# Patient Record
Sex: Female | Born: 1945 | Race: White | Hispanic: No | Marital: Married | Smoking: Never smoker
Health system: Southern US, Community
[De-identification: ages and names within clinical notes are randomized; demographics above are authoritative.]

## PROBLEM LIST (undated history)

## (undated) DIAGNOSIS — T4145XA Adverse effect of unspecified anesthetic, initial encounter: Secondary | ICD-10-CM

## (undated) DIAGNOSIS — M65312 Trigger thumb, left thumb: Secondary | ICD-10-CM

## (undated) DIAGNOSIS — R112 Nausea with vomiting, unspecified: Secondary | ICD-10-CM

## (undated) DIAGNOSIS — Z9889 Other specified postprocedural states: Secondary | ICD-10-CM

## (undated) DIAGNOSIS — T8859XA Other complications of anesthesia, initial encounter: Secondary | ICD-10-CM

## (undated) DIAGNOSIS — G5600 Carpal tunnel syndrome, unspecified upper limb: Secondary | ICD-10-CM

## (undated) HISTORY — PX: TUBAL LIGATION: SHX77

## (undated) HISTORY — PX: CARPAL TUNNEL RELEASE: SHX101

## (undated) HISTORY — PX: CLAVICLE SURGERY: SHX598

## (undated) HISTORY — PX: EYE SURGERY: SHX253

---

## 1999-06-30 ENCOUNTER — Encounter: Admission: RE | Admit: 1999-06-30 | Discharge: 1999-06-30 | Payer: Self-pay | Admitting: Family Medicine

## 1999-06-30 ENCOUNTER — Encounter: Payer: Self-pay | Admitting: Family Medicine

## 2000-01-15 ENCOUNTER — Other Ambulatory Visit: Admission: RE | Admit: 2000-01-15 | Discharge: 2000-01-15 | Payer: Self-pay | Admitting: Family Medicine

## 2000-01-19 ENCOUNTER — Encounter: Payer: Self-pay | Admitting: Family Medicine

## 2000-01-19 ENCOUNTER — Encounter: Admission: RE | Admit: 2000-01-19 | Discharge: 2000-01-19 | Payer: Self-pay | Admitting: Family Medicine

## 2001-02-04 ENCOUNTER — Encounter: Admission: RE | Admit: 2001-02-04 | Discharge: 2001-02-04 | Payer: Self-pay | Admitting: Family Medicine

## 2001-02-04 ENCOUNTER — Encounter: Payer: Self-pay | Admitting: Family Medicine

## 2001-02-04 ENCOUNTER — Other Ambulatory Visit: Admission: RE | Admit: 2001-02-04 | Discharge: 2001-02-04 | Payer: Self-pay | Admitting: Family Medicine

## 2002-02-12 ENCOUNTER — Encounter: Admission: RE | Admit: 2002-02-12 | Discharge: 2002-02-12 | Payer: Self-pay | Admitting: Family Medicine

## 2002-02-12 ENCOUNTER — Encounter: Payer: Self-pay | Admitting: Family Medicine

## 2002-02-19 ENCOUNTER — Other Ambulatory Visit: Admission: RE | Admit: 2002-02-19 | Discharge: 2002-02-19 | Payer: Self-pay | Admitting: Family Medicine

## 2003-01-05 ENCOUNTER — Ambulatory Visit (HOSPITAL_BASED_OUTPATIENT_CLINIC_OR_DEPARTMENT_OTHER): Admission: RE | Admit: 2003-01-05 | Discharge: 2003-01-05 | Payer: Self-pay | Admitting: Orthopedic Surgery

## 2003-04-01 ENCOUNTER — Encounter: Payer: Self-pay | Admitting: Family Medicine

## 2003-04-01 ENCOUNTER — Encounter: Admission: RE | Admit: 2003-04-01 | Discharge: 2003-04-01 | Payer: Self-pay | Admitting: Family Medicine

## 2004-05-04 ENCOUNTER — Other Ambulatory Visit: Admission: RE | Admit: 2004-05-04 | Discharge: 2004-05-04 | Payer: Self-pay | Admitting: Family Medicine

## 2004-05-05 ENCOUNTER — Encounter: Admission: RE | Admit: 2004-05-05 | Discharge: 2004-05-05 | Payer: Self-pay | Admitting: Family Medicine

## 2005-05-09 ENCOUNTER — Other Ambulatory Visit: Admission: RE | Admit: 2005-05-09 | Discharge: 2005-05-09 | Payer: Self-pay | Admitting: Family Medicine

## 2005-06-07 ENCOUNTER — Encounter: Admission: RE | Admit: 2005-06-07 | Discharge: 2005-06-07 | Payer: Self-pay | Admitting: Family Medicine

## 2006-06-11 ENCOUNTER — Other Ambulatory Visit: Admission: RE | Admit: 2006-06-11 | Discharge: 2006-06-11 | Payer: Self-pay | Admitting: Family Medicine

## 2006-06-21 ENCOUNTER — Encounter: Admission: RE | Admit: 2006-06-21 | Discharge: 2006-06-21 | Payer: Self-pay | Admitting: Family Medicine

## 2007-06-24 ENCOUNTER — Encounter: Admission: RE | Admit: 2007-06-24 | Discharge: 2007-06-24 | Payer: Self-pay | Admitting: Family Medicine

## 2007-06-24 ENCOUNTER — Other Ambulatory Visit: Admission: RE | Admit: 2007-06-24 | Discharge: 2007-06-24 | Payer: Self-pay | Admitting: Family Medicine

## 2008-06-24 ENCOUNTER — Encounter: Admission: RE | Admit: 2008-06-24 | Discharge: 2008-06-24 | Payer: Self-pay | Admitting: Family Medicine

## 2008-07-01 ENCOUNTER — Other Ambulatory Visit: Admission: RE | Admit: 2008-07-01 | Discharge: 2008-07-01 | Payer: Self-pay | Admitting: Family Medicine

## 2008-07-05 ENCOUNTER — Encounter: Admission: RE | Admit: 2008-07-05 | Discharge: 2008-07-05 | Payer: Self-pay | Admitting: Family Medicine

## 2009-03-26 ENCOUNTER — Encounter: Admission: RE | Admit: 2009-03-26 | Discharge: 2009-03-26 | Payer: Self-pay | Admitting: Family Medicine

## 2009-07-07 ENCOUNTER — Encounter: Admission: RE | Admit: 2009-07-07 | Discharge: 2009-07-07 | Payer: Self-pay | Admitting: Internal Medicine

## 2010-07-10 ENCOUNTER — Encounter: Admission: RE | Admit: 2010-07-10 | Discharge: 2010-07-10 | Payer: Self-pay | Admitting: Internal Medicine

## 2010-07-31 ENCOUNTER — Other Ambulatory Visit
Admission: RE | Admit: 2010-07-31 | Discharge: 2010-07-31 | Payer: Self-pay | Source: Home / Self Care | Admitting: Internal Medicine

## 2011-01-12 NOTE — Op Note (Signed)
   NAME:  Traci Chavez, Traci Chavez NO.:  [id number]   MEDICAL RECORD NO.:  [id number]                   PATIENT TYPE:  AMB   LOCATION:  DSC                                  FACILITY:  [facility]   PHYSICIAN:  Cindee Salt, M.D.                    DATE OF BIRTH:  07-28-1946   DATE OF PROCEDURE:  01/05/2003  DATE OF DISCHARGE:                                 OPERATIVE REPORT   PREOPERATIVE DIAGNOSIS:  Carpal tunnel syndrome, right hand.   POSTOPERATIVE DIAGNOSIS:  Carpal tunnel syndrome, right hand.   OPERATION:  Decompression of right median nerve.   SURGEON:  Cindee Salt, M.D.   ASSISTANT:  Alfredo Bach, R.N.   ANESTHESIA:  Forearm-based IV regional.   HISTORY:  The patient is a 65 year old female with a history of carpal  tunnel syndrome, EMG and nerve conductions positive, which has not responded  to conservative treatment.   DESCRIPTION OF PROCEDURE:  The patient was brought to the operating room  where a forearm-based IV regional anesthetic was carried out without  difficulty.  She was prepped and draped using Duraprep, supine position,  right arm free.  A longitudinal incision was made in the palm and carried  down through subcutaneous tissue.  Bleeders were electrocauterized.  The  palmar fascia was split.  The superficial palmar arch was identified, the  flexor tendons to the ring and little finger identified.  To the ulnar side  of the median nerve, the carpal retinaculum was incised with sharp  dissection.  A right-angle and Sewall retractor were placed between skin and  forearm fascia.  The fascia was released for approximately 3 cm proximal to  the wrist crease under direct vision.  Canal was explored.  Moderate  erythematous changes and neovascularization were present on the nerve with a  mild deformity, hourglass in nature, observed.  The tenosynovial tissue was  moderately thickened.  No further lesions were identified.  The wound was  irrigated.   The skin was closed with interrupted 5-0 nylon sutures.  A  sterile compressive dressing and splint were applied.  The patient tolerated  the procedure well and was taken to the recovery room for observation in  satisfactory condition.   She is discharged home to return to the [facility] in one  week on Vicodin and Keflex.                                               Cindee Salt, M.D.    GK/MEDQ  D:  01/05/2003  T:  01/06/2003  Job:  [id number]

## 2011-01-17 ENCOUNTER — Other Ambulatory Visit: Payer: Self-pay | Admitting: Internal Medicine

## 2011-01-17 DIAGNOSIS — N6310 Unspecified lump in the right breast, unspecified quadrant: Secondary | ICD-10-CM

## 2011-01-19 ENCOUNTER — Ambulatory Visit
Admission: RE | Admit: 2011-01-19 | Discharge: 2011-01-19 | Disposition: A | Discharge status: Home / Self Care | Payer: Medicare Other | Source: Ambulatory Visit | Attending: Internal Medicine | Admitting: Internal Medicine

## 2011-01-19 DIAGNOSIS — N6310 Unspecified lump in the right breast, unspecified quadrant: Secondary | ICD-10-CM

## 2011-01-31 ENCOUNTER — Other Ambulatory Visit: Payer: Self-pay | Admitting: Dermatology

## 2011-06-18 ENCOUNTER — Other Ambulatory Visit: Payer: Self-pay | Admitting: Internal Medicine

## 2011-06-18 DIAGNOSIS — Z1231 Encounter for screening mammogram for malignant neoplasm of breast: Secondary | ICD-10-CM

## 2011-07-09 ENCOUNTER — Ambulatory Visit: Payer: Medicare Other

## 2011-07-20 ENCOUNTER — Ambulatory Visit
Admission: RE | Admit: 2011-07-20 | Discharge: 2011-07-20 | Disposition: A | Discharge status: Home / Self Care | Payer: Medicare Other | Source: Ambulatory Visit | Attending: Internal Medicine | Admitting: Internal Medicine

## 2011-07-20 DIAGNOSIS — Z1231 Encounter for screening mammogram for malignant neoplasm of breast: Secondary | ICD-10-CM

## 2012-06-10 ENCOUNTER — Other Ambulatory Visit: Payer: Self-pay | Admitting: Internal Medicine

## 2012-06-10 DIAGNOSIS — Z1231 Encounter for screening mammogram for malignant neoplasm of breast: Secondary | ICD-10-CM

## 2012-07-21 ENCOUNTER — Ambulatory Visit
Admission: RE | Admit: 2012-07-21 | Discharge: 2012-07-21 | Disposition: A | Discharge status: Home / Self Care | Payer: Medicare Other | Source: Ambulatory Visit | Attending: Internal Medicine | Admitting: Internal Medicine

## 2012-07-21 DIAGNOSIS — Z1231 Encounter for screening mammogram for malignant neoplasm of breast: Secondary | ICD-10-CM | POA: Diagnosis not present

## 2012-07-23 ENCOUNTER — Other Ambulatory Visit: Payer: Self-pay | Admitting: Internal Medicine

## 2012-07-23 DIAGNOSIS — R928 Other abnormal and inconclusive findings on diagnostic imaging of breast: Secondary | ICD-10-CM

## 2012-07-29 ENCOUNTER — Ambulatory Visit
Admission: RE | Admit: 2012-07-29 | Discharge: 2012-07-29 | Disposition: A | Discharge status: Home / Self Care | Payer: Medicare Other | Source: Ambulatory Visit | Attending: Internal Medicine | Admitting: Internal Medicine

## 2012-07-29 DIAGNOSIS — R928 Other abnormal and inconclusive findings on diagnostic imaging of breast: Secondary | ICD-10-CM

## 2012-08-14 DIAGNOSIS — H04129 Dry eye syndrome of unspecified lacrimal gland: Secondary | ICD-10-CM | POA: Diagnosis not present

## 2012-08-14 DIAGNOSIS — H2510 Age-related nuclear cataract, unspecified eye: Secondary | ICD-10-CM | POA: Diagnosis not present

## 2012-08-28 DIAGNOSIS — Z1331 Encounter for screening for depression: Secondary | ICD-10-CM | POA: Diagnosis not present

## 2012-08-28 DIAGNOSIS — E785 Hyperlipidemia, unspecified: Secondary | ICD-10-CM | POA: Diagnosis not present

## 2012-08-28 DIAGNOSIS — M5137 Other intervertebral disc degeneration, lumbosacral region: Secondary | ICD-10-CM | POA: Diagnosis not present

## 2012-08-28 DIAGNOSIS — Z0000 Encounter for general adult medical examination without abnormal findings: Secondary | ICD-10-CM | POA: Diagnosis not present

## 2012-08-28 DIAGNOSIS — R7309 Other abnormal glucose: Secondary | ICD-10-CM | POA: Diagnosis not present

## 2012-08-28 DIAGNOSIS — D649 Anemia, unspecified: Secondary | ICD-10-CM | POA: Diagnosis not present

## 2013-02-05 DIAGNOSIS — J019 Acute sinusitis, unspecified: Secondary | ICD-10-CM | POA: Diagnosis not present

## 2013-04-01 DIAGNOSIS — N390 Urinary tract infection, site not specified: Secondary | ICD-10-CM | POA: Diagnosis not present

## 2013-06-19 DIAGNOSIS — N390 Urinary tract infection, site not specified: Secondary | ICD-10-CM | POA: Diagnosis not present

## 2013-06-19 DIAGNOSIS — R300 Dysuria: Secondary | ICD-10-CM | POA: Diagnosis not present

## 2013-07-01 ENCOUNTER — Other Ambulatory Visit: Payer: Self-pay

## 2013-07-01 DIAGNOSIS — Z1231 Encounter for screening mammogram for malignant neoplasm of breast: Secondary | ICD-10-CM

## 2013-07-17 ENCOUNTER — Other Ambulatory Visit: Payer: Self-pay | Admitting: Obstetrics & Gynecology

## 2013-07-17 ENCOUNTER — Other Ambulatory Visit (HOSPITAL_COMMUNITY)
Admission: RE | Admit: 2013-07-17 | Discharge: 2013-07-17 | Disposition: A | Discharge status: Home / Self Care | Payer: Medicare Other | Source: Ambulatory Visit | Attending: Obstetrics & Gynecology | Admitting: Obstetrics & Gynecology

## 2013-07-17 DIAGNOSIS — Z124 Encounter for screening for malignant neoplasm of cervix: Secondary | ICD-10-CM | POA: Insufficient documentation

## 2013-07-17 DIAGNOSIS — N905 Atrophy of vulva: Secondary | ICD-10-CM | POA: Diagnosis not present

## 2013-07-17 DIAGNOSIS — N9089 Other specified noninflammatory disorders of vulva and perineum: Secondary | ICD-10-CM | POA: Diagnosis not present

## 2013-07-17 DIAGNOSIS — Z1151 Encounter for screening for human papillomavirus (HPV): Secondary | ICD-10-CM | POA: Diagnosis not present

## 2013-07-17 DIAGNOSIS — L940 Localized scleroderma [morphea]: Secondary | ICD-10-CM | POA: Diagnosis not present

## 2013-07-17 DIAGNOSIS — Z01419 Encounter for gynecological examination (general) (routine) without abnormal findings: Secondary | ICD-10-CM | POA: Diagnosis not present

## 2013-08-04 ENCOUNTER — Ambulatory Visit
Admission: RE | Admit: 2013-08-04 | Discharge: 2013-08-04 | Disposition: A | Discharge status: Home / Self Care | Payer: Medicare Other | Source: Ambulatory Visit

## 2013-08-04 DIAGNOSIS — Z1231 Encounter for screening mammogram for malignant neoplasm of breast: Secondary | ICD-10-CM

## 2013-08-24 DIAGNOSIS — N905 Atrophy of vulva: Secondary | ICD-10-CM | POA: Diagnosis not present

## 2013-08-24 DIAGNOSIS — N72 Inflammatory disease of cervix uteri: Secondary | ICD-10-CM | POA: Diagnosis not present

## 2013-09-01 DIAGNOSIS — Z23 Encounter for immunization: Secondary | ICD-10-CM | POA: Diagnosis not present

## 2013-09-01 DIAGNOSIS — Z0000 Encounter for general adult medical examination without abnormal findings: Secondary | ICD-10-CM | POA: Diagnosis not present

## 2013-09-01 DIAGNOSIS — R7309 Other abnormal glucose: Secondary | ICD-10-CM | POA: Diagnosis not present

## 2013-09-01 DIAGNOSIS — Z1331 Encounter for screening for depression: Secondary | ICD-10-CM | POA: Diagnosis not present

## 2013-09-01 DIAGNOSIS — E785 Hyperlipidemia, unspecified: Secondary | ICD-10-CM | POA: Diagnosis not present

## 2013-09-01 DIAGNOSIS — D649 Anemia, unspecified: Secondary | ICD-10-CM | POA: Diagnosis not present

## 2013-09-07 DIAGNOSIS — H04129 Dry eye syndrome of unspecified lacrimal gland: Secondary | ICD-10-CM | POA: Diagnosis not present

## 2013-09-07 DIAGNOSIS — H2510 Age-related nuclear cataract, unspecified eye: Secondary | ICD-10-CM | POA: Diagnosis not present

## 2013-11-09 DIAGNOSIS — S61209A Unspecified open wound of unspecified finger without damage to nail, initial encounter: Secondary | ICD-10-CM | POA: Diagnosis not present

## 2013-12-31 ENCOUNTER — Other Ambulatory Visit: Payer: Self-pay | Admitting: Dermatology

## 2013-12-31 DIAGNOSIS — L820 Inflamed seborrheic keratosis: Secondary | ICD-10-CM | POA: Diagnosis not present

## 2013-12-31 DIAGNOSIS — C44320 Squamous cell carcinoma of skin of unspecified parts of face: Secondary | ICD-10-CM | POA: Diagnosis not present

## 2014-01-14 DIAGNOSIS — Z85828 Personal history of other malignant neoplasm of skin: Secondary | ICD-10-CM | POA: Diagnosis not present

## 2014-01-14 DIAGNOSIS — C44319 Basal cell carcinoma of skin of other parts of face: Secondary | ICD-10-CM | POA: Diagnosis not present

## 2014-02-25 DIAGNOSIS — N905 Atrophy of vulva: Secondary | ICD-10-CM | POA: Diagnosis not present

## 2014-02-25 DIAGNOSIS — N7689 Other specified inflammation of vagina and vulva: Secondary | ICD-10-CM | POA: Diagnosis not present

## 2014-02-25 DIAGNOSIS — N72 Inflammatory disease of cervix uteri: Secondary | ICD-10-CM | POA: Diagnosis not present

## 2014-07-01 DIAGNOSIS — H6691 Otitis media, unspecified, right ear: Secondary | ICD-10-CM | POA: Diagnosis not present

## 2014-07-05 ENCOUNTER — Other Ambulatory Visit: Payer: Self-pay

## 2014-07-05 DIAGNOSIS — Z1231 Encounter for screening mammogram for malignant neoplasm of breast: Secondary | ICD-10-CM

## 2014-07-06 DIAGNOSIS — H7291 Unspecified perforation of tympanic membrane, right ear: Secondary | ICD-10-CM | POA: Diagnosis not present

## 2014-07-06 DIAGNOSIS — H9201 Otalgia, right ear: Secondary | ICD-10-CM | POA: Diagnosis not present

## 2014-07-12 DIAGNOSIS — H902 Conductive hearing loss, unspecified: Secondary | ICD-10-CM | POA: Diagnosis not present

## 2014-07-12 DIAGNOSIS — H6501 Acute serous otitis media, right ear: Secondary | ICD-10-CM | POA: Diagnosis not present

## 2014-07-21 DIAGNOSIS — Z85828 Personal history of other malignant neoplasm of skin: Secondary | ICD-10-CM | POA: Diagnosis not present

## 2014-07-21 DIAGNOSIS — L814 Other melanin hyperpigmentation: Secondary | ICD-10-CM | POA: Diagnosis not present

## 2014-07-21 DIAGNOSIS — L919 Hypertrophic disorder of the skin, unspecified: Secondary | ICD-10-CM | POA: Diagnosis not present

## 2014-07-21 DIAGNOSIS — D2371 Other benign neoplasm of skin of right lower limb, including hip: Secondary | ICD-10-CM | POA: Diagnosis not present

## 2014-07-21 DIAGNOSIS — D1801 Hemangioma of skin and subcutaneous tissue: Secondary | ICD-10-CM | POA: Diagnosis not present

## 2014-08-05 ENCOUNTER — Ambulatory Visit
Admission: RE | Admit: 2014-08-05 | Discharge: 2014-08-05 | Disposition: A | Discharge status: Home / Self Care | Payer: Medicare Other | Source: Ambulatory Visit

## 2014-08-05 DIAGNOSIS — Z1231 Encounter for screening mammogram for malignant neoplasm of breast: Secondary | ICD-10-CM | POA: Diagnosis not present

## 2014-09-02 DIAGNOSIS — M519 Unspecified thoracic, thoracolumbar and lumbosacral intervertebral disc disorder: Secondary | ICD-10-CM | POA: Diagnosis not present

## 2014-09-02 DIAGNOSIS — E780 Pure hypercholesterolemia: Secondary | ICD-10-CM | POA: Diagnosis not present

## 2014-09-02 DIAGNOSIS — Z23 Encounter for immunization: Secondary | ICD-10-CM | POA: Diagnosis not present

## 2014-09-02 DIAGNOSIS — Z0000 Encounter for general adult medical examination without abnormal findings: Secondary | ICD-10-CM | POA: Diagnosis not present

## 2014-09-02 DIAGNOSIS — Z1389 Encounter for screening for other disorder: Secondary | ICD-10-CM | POA: Diagnosis not present

## 2014-09-02 DIAGNOSIS — D509 Iron deficiency anemia, unspecified: Secondary | ICD-10-CM | POA: Diagnosis not present

## 2014-09-23 DIAGNOSIS — H2513 Age-related nuclear cataract, bilateral: Secondary | ICD-10-CM | POA: Diagnosis not present

## 2015-01-17 DIAGNOSIS — K1379 Other lesions of oral mucosa: Secondary | ICD-10-CM | POA: Diagnosis not present

## 2015-01-18 DIAGNOSIS — K1379 Other lesions of oral mucosa: Secondary | ICD-10-CM | POA: Diagnosis not present

## 2015-01-25 DIAGNOSIS — K1379 Other lesions of oral mucosa: Secondary | ICD-10-CM | POA: Diagnosis not present

## 2015-05-13 DIAGNOSIS — Z85828 Personal history of other malignant neoplasm of skin: Secondary | ICD-10-CM | POA: Diagnosis not present

## 2015-05-13 DIAGNOSIS — L820 Inflamed seborrheic keratosis: Secondary | ICD-10-CM | POA: Diagnosis not present

## 2015-05-19 DIAGNOSIS — N905 Atrophy of vulva: Secondary | ICD-10-CM | POA: Diagnosis not present

## 2015-05-19 DIAGNOSIS — N7689 Other specified inflammation of vagina and vulva: Secondary | ICD-10-CM | POA: Diagnosis not present

## 2015-08-18 ENCOUNTER — Other Ambulatory Visit: Payer: Self-pay

## 2015-08-18 DIAGNOSIS — Z1231 Encounter for screening mammogram for malignant neoplasm of breast: Secondary | ICD-10-CM

## 2015-09-08 DIAGNOSIS — M899 Disorder of bone, unspecified: Secondary | ICD-10-CM | POA: Diagnosis not present

## 2015-09-08 DIAGNOSIS — E7800 Pure hypercholesterolemia, unspecified: Secondary | ICD-10-CM | POA: Diagnosis not present

## 2015-09-08 DIAGNOSIS — Z1389 Encounter for screening for other disorder: Secondary | ICD-10-CM | POA: Diagnosis not present

## 2015-09-08 DIAGNOSIS — Z23 Encounter for immunization: Secondary | ICD-10-CM | POA: Diagnosis not present

## 2015-09-08 DIAGNOSIS — Z0000 Encounter for general adult medical examination without abnormal findings: Secondary | ICD-10-CM | POA: Diagnosis not present

## 2015-09-08 DIAGNOSIS — M1990 Unspecified osteoarthritis, unspecified site: Secondary | ICD-10-CM | POA: Diagnosis not present

## 2015-09-08 DIAGNOSIS — M519 Unspecified thoracic, thoracolumbar and lumbosacral intervertebral disc disorder: Secondary | ICD-10-CM | POA: Diagnosis not present

## 2015-09-08 DIAGNOSIS — D509 Iron deficiency anemia, unspecified: Secondary | ICD-10-CM | POA: Diagnosis not present

## 2015-09-19 ENCOUNTER — Ambulatory Visit
Admission: RE | Admit: 2015-09-19 | Discharge: 2015-09-19 | Disposition: A | Discharge status: Home / Self Care | Payer: Medicare Other | Source: Ambulatory Visit

## 2015-09-19 DIAGNOSIS — Z1231 Encounter for screening mammogram for malignant neoplasm of breast: Secondary | ICD-10-CM

## 2015-10-03 DIAGNOSIS — H04123 Dry eye syndrome of bilateral lacrimal glands: Secondary | ICD-10-CM | POA: Diagnosis not present

## 2015-10-03 DIAGNOSIS — H2513 Age-related nuclear cataract, bilateral: Secondary | ICD-10-CM | POA: Diagnosis not present

## 2015-10-06 DIAGNOSIS — D2262 Melanocytic nevi of left upper limb, including shoulder: Secondary | ICD-10-CM | POA: Diagnosis not present

## 2015-10-06 DIAGNOSIS — Z85828 Personal history of other malignant neoplasm of skin: Secondary | ICD-10-CM | POA: Diagnosis not present

## 2015-10-06 DIAGNOSIS — L814 Other melanin hyperpigmentation: Secondary | ICD-10-CM | POA: Diagnosis not present

## 2015-10-06 DIAGNOSIS — L821 Other seborrheic keratosis: Secondary | ICD-10-CM | POA: Diagnosis not present

## 2015-10-06 DIAGNOSIS — D485 Neoplasm of uncertain behavior of skin: Secondary | ICD-10-CM | POA: Diagnosis not present

## 2015-10-06 DIAGNOSIS — D1801 Hemangioma of skin and subcutaneous tissue: Secondary | ICD-10-CM | POA: Diagnosis not present

## 2015-10-06 DIAGNOSIS — L570 Actinic keratosis: Secondary | ICD-10-CM | POA: Diagnosis not present

## 2015-10-12 DIAGNOSIS — H2512 Age-related nuclear cataract, left eye: Secondary | ICD-10-CM | POA: Diagnosis not present

## 2015-10-12 DIAGNOSIS — H2511 Age-related nuclear cataract, right eye: Secondary | ICD-10-CM | POA: Diagnosis not present

## 2015-10-13 DIAGNOSIS — M859 Disorder of bone density and structure, unspecified: Secondary | ICD-10-CM | POA: Diagnosis not present

## 2015-10-13 DIAGNOSIS — M8589 Other specified disorders of bone density and structure, multiple sites: Secondary | ICD-10-CM | POA: Diagnosis not present

## 2015-10-19 DIAGNOSIS — H2511 Age-related nuclear cataract, right eye: Secondary | ICD-10-CM | POA: Diagnosis not present

## 2016-03-01 DIAGNOSIS — Z961 Presence of intraocular lens: Secondary | ICD-10-CM | POA: Diagnosis not present

## 2016-05-22 DIAGNOSIS — N952 Postmenopausal atrophic vaginitis: Secondary | ICD-10-CM | POA: Diagnosis not present

## 2016-05-22 DIAGNOSIS — Z01419 Encounter for gynecological examination (general) (routine) without abnormal findings: Secondary | ICD-10-CM | POA: Diagnosis not present

## 2016-05-22 DIAGNOSIS — N941 Unspecified dyspareunia: Secondary | ICD-10-CM | POA: Diagnosis not present

## 2016-08-17 ENCOUNTER — Other Ambulatory Visit: Payer: Self-pay | Admitting: Internal Medicine

## 2016-08-17 DIAGNOSIS — Z1231 Encounter for screening mammogram for malignant neoplasm of breast: Secondary | ICD-10-CM

## 2016-09-11 DIAGNOSIS — Z1159 Encounter for screening for other viral diseases: Secondary | ICD-10-CM | POA: Diagnosis not present

## 2016-09-11 DIAGNOSIS — Z0000 Encounter for general adult medical examination without abnormal findings: Secondary | ICD-10-CM | POA: Diagnosis not present

## 2016-09-11 DIAGNOSIS — M899 Disorder of bone, unspecified: Secondary | ICD-10-CM | POA: Diagnosis not present

## 2016-09-11 DIAGNOSIS — Z1389 Encounter for screening for other disorder: Secondary | ICD-10-CM | POA: Diagnosis not present

## 2016-09-11 DIAGNOSIS — M1990 Unspecified osteoarthritis, unspecified site: Secondary | ICD-10-CM | POA: Diagnosis not present

## 2016-09-11 DIAGNOSIS — E7800 Pure hypercholesterolemia, unspecified: Secondary | ICD-10-CM | POA: Diagnosis not present

## 2016-09-11 DIAGNOSIS — D509 Iron deficiency anemia, unspecified: Secondary | ICD-10-CM | POA: Diagnosis not present

## 2016-09-25 ENCOUNTER — Ambulatory Visit
Admission: RE | Admit: 2016-09-25 | Discharge: 2016-09-25 | Disposition: A | Discharge status: Home / Self Care | Payer: Medicare Other | Source: Ambulatory Visit | Attending: Internal Medicine | Admitting: Internal Medicine

## 2016-09-25 DIAGNOSIS — Z1231 Encounter for screening mammogram for malignant neoplasm of breast: Secondary | ICD-10-CM

## 2016-12-27 DIAGNOSIS — D2271 Melanocytic nevi of right lower limb, including hip: Secondary | ICD-10-CM | POA: Diagnosis not present

## 2016-12-27 DIAGNOSIS — L918 Other hypertrophic disorders of the skin: Secondary | ICD-10-CM | POA: Diagnosis not present

## 2016-12-27 DIAGNOSIS — D2261 Melanocytic nevi of right upper limb, including shoulder: Secondary | ICD-10-CM | POA: Diagnosis not present

## 2016-12-27 DIAGNOSIS — L821 Other seborrheic keratosis: Secondary | ICD-10-CM | POA: Diagnosis not present

## 2016-12-27 DIAGNOSIS — L814 Other melanin hyperpigmentation: Secondary | ICD-10-CM | POA: Diagnosis not present

## 2016-12-27 DIAGNOSIS — D485 Neoplasm of uncertain behavior of skin: Secondary | ICD-10-CM | POA: Diagnosis not present

## 2016-12-27 DIAGNOSIS — D225 Melanocytic nevi of trunk: Secondary | ICD-10-CM | POA: Diagnosis not present

## 2016-12-27 DIAGNOSIS — D2272 Melanocytic nevi of left lower limb, including hip: Secondary | ICD-10-CM | POA: Diagnosis not present

## 2016-12-27 DIAGNOSIS — D2262 Melanocytic nevi of left upper limb, including shoulder: Secondary | ICD-10-CM | POA: Diagnosis not present

## 2016-12-27 DIAGNOSIS — D2371 Other benign neoplasm of skin of right lower limb, including hip: Secondary | ICD-10-CM | POA: Diagnosis not present

## 2016-12-27 DIAGNOSIS — Z85828 Personal history of other malignant neoplasm of skin: Secondary | ICD-10-CM | POA: Diagnosis not present

## 2016-12-27 DIAGNOSIS — D1801 Hemangioma of skin and subcutaneous tissue: Secondary | ICD-10-CM | POA: Diagnosis not present

## 2017-01-02 DIAGNOSIS — R202 Paresthesia of skin: Secondary | ICD-10-CM | POA: Diagnosis not present

## 2017-01-02 DIAGNOSIS — M47812 Spondylosis without myelopathy or radiculopathy, cervical region: Secondary | ICD-10-CM | POA: Diagnosis not present

## 2017-01-02 DIAGNOSIS — G5602 Carpal tunnel syndrome, left upper limb: Secondary | ICD-10-CM | POA: Diagnosis not present

## 2017-01-02 DIAGNOSIS — M79642 Pain in left hand: Secondary | ICD-10-CM | POA: Diagnosis not present

## 2017-01-02 DIAGNOSIS — R200 Anesthesia of skin: Secondary | ICD-10-CM | POA: Diagnosis not present

## 2017-01-17 DIAGNOSIS — Z85828 Personal history of other malignant neoplasm of skin: Secondary | ICD-10-CM | POA: Diagnosis not present

## 2017-01-17 DIAGNOSIS — D485 Neoplasm of uncertain behavior of skin: Secondary | ICD-10-CM | POA: Diagnosis not present

## 2017-01-17 DIAGNOSIS — L988 Other specified disorders of the skin and subcutaneous tissue: Secondary | ICD-10-CM | POA: Diagnosis not present

## 2017-01-18 DIAGNOSIS — M5412 Radiculopathy, cervical region: Secondary | ICD-10-CM | POA: Diagnosis not present

## 2017-01-18 DIAGNOSIS — G5602 Carpal tunnel syndrome, left upper limb: Secondary | ICD-10-CM | POA: Diagnosis not present

## 2017-02-13 DIAGNOSIS — R200 Anesthesia of skin: Secondary | ICD-10-CM | POA: Diagnosis not present

## 2017-02-13 DIAGNOSIS — M47812 Spondylosis without myelopathy or radiculopathy, cervical region: Secondary | ICD-10-CM | POA: Diagnosis not present

## 2017-02-13 DIAGNOSIS — M79622 Pain in left upper arm: Secondary | ICD-10-CM | POA: Diagnosis not present

## 2017-03-04 DIAGNOSIS — M26629 Arthralgia of temporomandibular joint, unspecified side: Secondary | ICD-10-CM | POA: Diagnosis not present

## 2017-03-07 DIAGNOSIS — Z961 Presence of intraocular lens: Secondary | ICD-10-CM | POA: Diagnosis not present

## 2017-04-12 DIAGNOSIS — M47812 Spondylosis without myelopathy or radiculopathy, cervical region: Secondary | ICD-10-CM | POA: Diagnosis not present

## 2017-04-12 DIAGNOSIS — G5602 Carpal tunnel syndrome, left upper limb: Secondary | ICD-10-CM | POA: Diagnosis not present

## 2017-04-12 DIAGNOSIS — R200 Anesthesia of skin: Secondary | ICD-10-CM | POA: Diagnosis not present

## 2017-04-12 DIAGNOSIS — M65312 Trigger thumb, left thumb: Secondary | ICD-10-CM | POA: Diagnosis not present

## 2017-05-13 ENCOUNTER — Other Ambulatory Visit: Payer: Self-pay | Admitting: Orthopedic Surgery

## 2017-05-13 DIAGNOSIS — M65312 Trigger thumb, left thumb: Secondary | ICD-10-CM | POA: Diagnosis not present

## 2017-05-13 DIAGNOSIS — M47812 Spondylosis without myelopathy or radiculopathy, cervical region: Secondary | ICD-10-CM | POA: Diagnosis not present

## 2017-05-13 DIAGNOSIS — G5602 Carpal tunnel syndrome, left upper limb: Secondary | ICD-10-CM | POA: Diagnosis not present

## 2017-05-23 DIAGNOSIS — N941 Unspecified dyspareunia: Secondary | ICD-10-CM | POA: Diagnosis not present

## 2017-05-23 DIAGNOSIS — N952 Postmenopausal atrophic vaginitis: Secondary | ICD-10-CM | POA: Diagnosis not present

## 2017-05-27 ENCOUNTER — Encounter (HOSPITAL_BASED_OUTPATIENT_CLINIC_OR_DEPARTMENT_OTHER): Payer: Self-pay | Admitting: *Deleted

## 2017-06-04 ENCOUNTER — Ambulatory Visit (HOSPITAL_BASED_OUTPATIENT_CLINIC_OR_DEPARTMENT_OTHER): Admission: RE | Admit: 2017-06-04 | Payer: Medicare Other | Source: Ambulatory Visit | Admitting: Orthopedic Surgery

## 2017-06-04 HISTORY — DX: Trigger thumb, left thumb: M65.312

## 2017-06-04 HISTORY — DX: Other specified postprocedural states: Z98.890

## 2017-06-04 HISTORY — DX: Carpal tunnel syndrome, unspecified upper limb: G56.00

## 2017-06-04 HISTORY — DX: Other complications of anesthesia, initial encounter: T88.59XA

## 2017-06-04 HISTORY — DX: Adverse effect of unspecified anesthetic, initial encounter: T41.45XA

## 2017-06-04 HISTORY — DX: Other specified postprocedural states: R11.2

## 2017-06-04 SURGERY — CARPAL TUNNEL RELEASE
Anesthesia: Regional | Laterality: Left

## 2017-06-26 DIAGNOSIS — M65312 Trigger thumb, left thumb: Secondary | ICD-10-CM | POA: Diagnosis not present

## 2017-06-26 DIAGNOSIS — G5602 Carpal tunnel syndrome, left upper limb: Secondary | ICD-10-CM | POA: Diagnosis not present

## 2017-07-03 ENCOUNTER — Other Ambulatory Visit: Payer: Self-pay | Admitting: Orthopedic Surgery

## 2017-08-22 ENCOUNTER — Encounter (HOSPITAL_BASED_OUTPATIENT_CLINIC_OR_DEPARTMENT_OTHER): Payer: Self-pay | Admitting: *Deleted

## 2017-08-29 ENCOUNTER — Other Ambulatory Visit: Payer: Self-pay

## 2017-08-29 ENCOUNTER — Encounter (HOSPITAL_BASED_OUTPATIENT_CLINIC_OR_DEPARTMENT_OTHER)
Admission: RE | Disposition: A | Discharge status: Home / Self Care | Payer: Self-pay | Source: Ambulatory Visit | Attending: Orthopedic Surgery

## 2017-08-29 ENCOUNTER — Ambulatory Visit (HOSPITAL_BASED_OUTPATIENT_CLINIC_OR_DEPARTMENT_OTHER): Payer: Medicare Other | Admitting: Anesthesiology

## 2017-08-29 ENCOUNTER — Ambulatory Visit (HOSPITAL_BASED_OUTPATIENT_CLINIC_OR_DEPARTMENT_OTHER)
Admission: RE | Admit: 2017-08-29 | Discharge: 2017-08-29 | Disposition: A | Discharge status: Home / Self Care | Payer: Medicare Other | Source: Ambulatory Visit | Attending: Orthopedic Surgery | Admitting: Orthopedic Surgery

## 2017-08-29 ENCOUNTER — Encounter (HOSPITAL_BASED_OUTPATIENT_CLINIC_OR_DEPARTMENT_OTHER): Payer: Self-pay

## 2017-08-29 DIAGNOSIS — Z888 Allergy status to other drugs, medicaments and biological substances status: Secondary | ICD-10-CM | POA: Diagnosis not present

## 2017-08-29 DIAGNOSIS — G5602 Carpal tunnel syndrome, left upper limb: Secondary | ICD-10-CM | POA: Insufficient documentation

## 2017-08-29 DIAGNOSIS — M4802 Spinal stenosis, cervical region: Secondary | ICD-10-CM | POA: Insufficient documentation

## 2017-08-29 DIAGNOSIS — M65842 Other synovitis and tenosynovitis, left hand: Secondary | ICD-10-CM | POA: Diagnosis not present

## 2017-08-29 DIAGNOSIS — M65312 Trigger thumb, left thumb: Secondary | ICD-10-CM | POA: Diagnosis not present

## 2017-08-29 HISTORY — PX: TRIGGER FINGER RELEASE: SHX641

## 2017-08-29 HISTORY — PX: CARPAL TUNNEL RELEASE: SHX101

## 2017-08-29 SURGERY — CARPAL TUNNEL RELEASE
Anesthesia: Regional | Site: Wrist | Laterality: Left

## 2017-08-29 MED ORDER — FENTANYL CITRATE (PF) 100 MCG/2ML IJ SOLN
INTRAMUSCULAR | Status: AC
  Filled 2017-08-29: qty 2

## 2017-08-29 MED ORDER — CEFAZOLIN SODIUM-DEXTROSE 2-4 GM/100ML-% IV SOLN
INTRAVENOUS | Status: AC
  Filled 2017-08-29: qty 100

## 2017-08-29 MED ORDER — EPHEDRINE 5 MG/ML INJ
INTRAVENOUS | Status: AC
  Filled 2017-08-29: qty 10

## 2017-08-29 MED ORDER — LIDOCAINE 2% (20 MG/ML) 5 ML SYRINGE
INTRAMUSCULAR | Status: AC
Start: 2017-08-29 — End: ?
  Filled 2017-08-29: qty 5

## 2017-08-29 MED ORDER — LACTATED RINGERS IV SOLN: INTRAVENOUS | Status: DC

## 2017-08-29 MED ORDER — LIDOCAINE HCL (PF) 0.5 % IJ SOLN
INTRAMUSCULAR | Status: DC | PRN
  Administered 2017-08-29: 30 mL via INTRAVENOUS

## 2017-08-29 MED ORDER — FENTANYL CITRATE (PF) 100 MCG/2ML IJ SOLN: 25.0000 ug | INTRAMUSCULAR | Status: DC | PRN

## 2017-08-29 MED ORDER — MEPERIDINE HCL 25 MG/ML IJ SOLN: 6.2500 mg | INTRAMUSCULAR | Status: DC | PRN

## 2017-08-29 MED ORDER — CEFAZOLIN SODIUM-DEXTROSE 2-4 GM/100ML-% IV SOLN
2.0000 g | INTRAVENOUS | Status: AC
  Administered 2017-08-29: 2 g via INTRAVENOUS

## 2017-08-29 MED ORDER — METOCLOPRAMIDE HCL 5 MG/ML IJ SOLN: 10.0000 mg | Freq: Once | INTRAMUSCULAR | Status: DC | PRN

## 2017-08-29 MED ORDER — SCOPOLAMINE 1 MG/3DAYS TD PT72: 1.0000 | MEDICATED_PATCH | Freq: Once | TRANSDERMAL | Status: DC | PRN

## 2017-08-29 MED ORDER — FENTANYL CITRATE (PF) 100 MCG/2ML IJ SOLN
50.0000 ug | INTRAMUSCULAR | Status: DC | PRN
  Administered 2017-08-29 (×2): 50 ug via INTRAVENOUS

## 2017-08-29 MED ORDER — BUPIVACAINE HCL (PF) 0.25 % IJ SOLN
INTRAMUSCULAR | Status: DC | PRN
  Administered 2017-08-29: 10 mL

## 2017-08-29 MED ORDER — LACTATED RINGERS IV SOLN
INTRAVENOUS | Status: DC
  Administered 2017-08-29: 08:00:00 via INTRAVENOUS

## 2017-08-29 MED ORDER — SUCCINYLCHOLINE CHLORIDE 200 MG/10ML IV SOSY
PREFILLED_SYRINGE | INTRAVENOUS | Status: AC
Start: 2017-08-29 — End: ?
  Filled 2017-08-29: qty 10

## 2017-08-29 MED ORDER — BUPIVACAINE HCL (PF) 0.25 % IJ SOLN
INTRAMUSCULAR | Status: AC
  Filled 2017-08-29: qty 120

## 2017-08-29 MED ORDER — CHLORHEXIDINE GLUCONATE 4 % EX LIQD: 60.0000 mL | Freq: Once | CUTANEOUS | Status: DC

## 2017-08-29 MED ORDER — ONDANSETRON HCL 4 MG/2ML IJ SOLN
INTRAMUSCULAR | Status: AC
  Filled 2017-08-29: qty 2

## 2017-08-29 MED ORDER — PHENYLEPHRINE 40 MCG/ML (10ML) SYRINGE FOR IV PUSH (FOR BLOOD PRESSURE SUPPORT)
PREFILLED_SYRINGE | INTRAVENOUS | Status: AC
  Filled 2017-08-29: qty 10

## 2017-08-29 MED ORDER — ONDANSETRON HCL 4 MG/2ML IJ SOLN
INTRAMUSCULAR | Status: DC | PRN
  Administered 2017-08-29: 4 mg via INTRAVENOUS

## 2017-08-29 MED ORDER — PROPOFOL 10 MG/ML IV BOLUS
INTRAVENOUS | Status: DC | PRN
  Administered 2017-08-29 (×2): 20 mg via INTRAVENOUS

## 2017-08-29 MED ORDER — MIDAZOLAM HCL 2 MG/2ML IJ SOLN
INTRAMUSCULAR | Status: AC
  Filled 2017-08-29: qty 2

## 2017-08-29 MED ORDER — TRAMADOL HCL 50 MG PO TABS: 50.0000 mg | ORAL_TABLET | Freq: Four times a day (QID) | ORAL | 0 refills | Status: DC | PRN

## 2017-08-29 MED ORDER — MIDAZOLAM HCL 2 MG/2ML IJ SOLN
1.0000 mg | INTRAMUSCULAR | Status: DC | PRN
  Administered 2017-08-29: 2 mg via INTRAVENOUS

## 2017-08-29 MED ORDER — DEXAMETHASONE SODIUM PHOSPHATE 10 MG/ML IJ SOLN
INTRAMUSCULAR | Status: AC
Start: 2017-08-29 — End: ?
  Filled 2017-08-29: qty 1

## 2017-08-29 SURGICAL SUPPLY — 40 items
BANDAGE COBAN STERILE 2 (GAUZE/BANDAGES/DRESSINGS) ×2 IMPLANT
BLADE SURG 15 STRL LF DISP TIS (BLADE) ×2 IMPLANT
BLADE SURG 15 STRL SS (BLADE) ×4
BNDG CMPR 9X4 STRL LF SNTH (GAUZE/BANDAGES/DRESSINGS)
BNDG COHESIVE 3X5 TAN STRL LF (GAUZE/BANDAGES/DRESSINGS) ×4 IMPLANT
BNDG ESMARK 4X9 LF (GAUZE/BANDAGES/DRESSINGS) IMPLANT
BNDG GAUZE ELAST 4 BULKY (GAUZE/BANDAGES/DRESSINGS) ×4 IMPLANT
CHLORAPREP W/TINT 26ML (MISCELLANEOUS) ×4 IMPLANT
CORD BIPOLAR FORCEPS 12FT (ELECTRODE) ×4 IMPLANT
COVER BACK TABLE 60X90IN (DRAPES) ×4 IMPLANT
COVER MAYO STAND STRL (DRAPES) ×4 IMPLANT
CUFF TOURNIQUET SINGLE 18IN (TOURNIQUET CUFF) ×4 IMPLANT
DECANTER SPIKE VIAL GLASS SM (MISCELLANEOUS) IMPLANT
DRAPE EXTREMITY T 121X128X90 (DRAPE) ×4 IMPLANT
DRAPE SURG 17X23 STRL (DRAPES) ×4 IMPLANT
DRSG PAD ABDOMINAL 8X10 ST (GAUZE/BANDAGES/DRESSINGS) ×4 IMPLANT
GAUZE SPONGE 4X4 12PLY STRL (GAUZE/BANDAGES/DRESSINGS) ×4 IMPLANT
GAUZE XEROFORM 1X8 LF (GAUZE/BANDAGES/DRESSINGS) ×4 IMPLANT
GLOVE BIOGEL PI IND STRL 7.5 (GLOVE) IMPLANT
GLOVE BIOGEL PI IND STRL 8.5 (GLOVE) ×2 IMPLANT
GLOVE BIOGEL PI INDICATOR 7.5 (GLOVE) ×4
GLOVE BIOGEL PI INDICATOR 8.5 (GLOVE) ×2
GLOVE SURG ORTHO 8.0 STRL STRW (GLOVE) ×4 IMPLANT
GLOVE SURG SYN 7.5  E (GLOVE) ×2
GLOVE SURG SYN 7.5 E (GLOVE) ×2 IMPLANT
GLOVE SURG SYN 7.5 PF PI (GLOVE) IMPLANT
GOWN STRL REUS W/ TWL LRG LVL3 (GOWN DISPOSABLE) ×2 IMPLANT
GOWN STRL REUS W/TWL LRG LVL3 (GOWN DISPOSABLE) ×4
GOWN STRL REUS W/TWL XL LVL3 (GOWN DISPOSABLE) ×4 IMPLANT
NDL PRECISIONGLIDE 27X1.5 (NEEDLE) ×2 IMPLANT
NEEDLE PRECISIONGLIDE 27X1.5 (NEEDLE) ×4 IMPLANT
NS IRRIG 1000ML POUR BTL (IV SOLUTION) ×4 IMPLANT
PACK BASIN DAY SURGERY FS (CUSTOM PROCEDURE TRAY) ×4 IMPLANT
STOCKINETTE 4X48 STRL (DRAPES) ×4 IMPLANT
SUT ETHILON 4 0 PS 2 18 (SUTURE) ×4 IMPLANT
SUT VICRYL 4-0 PS2 18IN ABS (SUTURE) IMPLANT
SYR BULB 3OZ (MISCELLANEOUS) ×4 IMPLANT
SYR CONTROL 10ML LL (SYRINGE) ×4 IMPLANT
TOWEL OR 17X24 6PK STRL BLUE (TOWEL DISPOSABLE) ×8 IMPLANT
UNDERPAD 30X30 (UNDERPADS AND DIAPERS) ×4 IMPLANT

## 2017-08-29 NOTE — Brief Op Note (Signed)
08/29/2017  9:12 AM  PATIENT:  Traci Chavez  72 y.o. female  PRE-OPERATIVE DIAGNOSIS:  CARPAL TUNNEL SYNDROME STENOSING TENOSYNOVITIS  POST-OPERATIVE DIAGNOSIS:  CARPAL TUNNEL SYNDROME STENOSING TENOSYNOVITIS  PROCEDURE:  Procedure(s): LEFT CARPAL TUNNEL RELEASE (Left) LEFT RELEASE TRIGGER FINGER/A-1 PULLEY (Left)  SURGEON:  Surgeon(s) and Role:    Daryll Brod, MD - Primary  PHYSICIAN ASSISTANT:   ASSISTANTS: none   ANESTHESIA:   local, regional and IV sedation  EBL:  10 mL   BLOOD ADMINISTERED:none  DRAINS: none   LOCAL MEDICATIONS USED:  BUPIVICAINE   SPECIMEN:  No Specimen  DISPOSITION OF SPECIMEN:  N/A  COUNTS:  YES  TOURNIQUET:   Total Tourniquet Time Documented: Forearm (Left) - 28 minutes Total: Forearm (Left) - 28 minutes   DICTATION: .Other Dictation: Dictation Number [id number]  PLAN OF CARE: Discharge to home after PACU  PATIENT DISPOSITION:  PACU - hemodynamically stable.

## 2017-08-29 NOTE — Anesthesia Postprocedure Evaluation (Signed)
Anesthesia Post Note  Patient: Traci Chavez  Procedure(s) Performed: LEFT CARPAL TUNNEL RELEASE (Left Wrist) LEFT RELEASE TRIGGER FINGER/A-1 PULLEY (Left Finger)     Patient location during evaluation: PACU Anesthesia Type: Bier Block Level of consciousness: awake and alert Pain management: pain level controlled Vital Signs Assessment: post-procedure vital signs reviewed and stable Respiratory status: spontaneous breathing, nonlabored ventilation, respiratory function stable and patient connected to nasal cannula oxygen Cardiovascular status: stable and blood pressure returned to baseline Postop Assessment: no apparent nausea or vomiting Anesthetic complications: no    Last Vitals:  Vitals:   08/29/17 0945 08/29/17 1017  BP: (!) 93/57 (!) 123/52  Pulse: (!) 54 (!) 52  Resp: 11 16  Temp:  36.4 C  SpO2: 93% 97%    Last Pain:  Vitals:   08/29/17 1017  TempSrc:   PainSc: 0-No pain                 Montez Hageman

## 2017-08-29 NOTE — Op Note (Signed)
Dictation Number [id number]

## 2017-08-29 NOTE — Transfer of Care (Signed)
Immediate Anesthesia Transfer of Care Note  Patient: Traci Chavez  Procedure(s) Performed: LEFT CARPAL TUNNEL RELEASE (Left Wrist) LEFT RELEASE TRIGGER FINGER/A-1 PULLEY (Left Finger)  Patient Location: PACU  Anesthesia Type:MAC and Bier block  Level of Consciousness: awake, alert  and oriented  Airway & Oxygen Therapy: Patient Spontanous Breathing and Patient connected to face mask oxygen  Post-op Assessment: Report given to RN and Post -op Vital signs reviewed and stable  Post vital signs: Reviewed and stable  Last Vitals:  Vitals:   08/29/17 0739  BP: 129/67  Pulse: 68  Resp: 18  Temp: 36.8 C  SpO2: 99%    Last Pain:  Vitals:   08/29/17 0739  TempSrc: Oral      Patients Stated Pain Goal: 3 (25/05/39 7673)  Complications: No apparent anesthesia complications

## 2017-08-29 NOTE — Discharge Instructions (Addendum)

## 2017-08-29 NOTE — Op Note (Signed)
NAME:  Nappier,                      ACCOUNT NO.:  [id number]  MEDICAL RECORD NO.:  [id number]  LOCATION:                                 FACILITY:  PHYSICIAN:  Daryll Brod, M.D.            DATE OF BIRTH:  DATE OF PROCEDURE:  08/29/2017 DATE OF DISCHARGE:                              OPERATIVE REPORT   PREOPERATIVE DIAGNOSES: 1. Carpal tunnel syndrome, left hand. 2. Stenosing tenosynovitis, left thumb.  POSTOPERATIVE DIAGNOSES: 1. Carpal tunnel syndrome, left hand. 2. Stenosing tenosynovitis, left thumb.  OPERATION: 1. Release A1 pulley, left thumb. 2. Release carpal canal, left wrist.  SURGEON:  Daryll Brod, MD.  ASSISTANT:  None.  ANESTHESIA:  Forearm IV regional with IV sedation local infiltration.  PLACE OF SURGERY:  [facility].  ANESTHESIOLOGIST:  Montez Hageman, MD.  HISTORY:  The patient is a 72 year old female with a history of triggering of her left thumb and numbness and tingling with positive nerve conductions not responded to conservative treatment.  She has elected to undergo surgical release of the A1 pulley of the left thumb, carpal tunnel release on her left hand.  Pre, peri, and postoperative diagnosis have been discussed with her.  She is aware that there is no guarantee to the surgery; the possibility of infection; recurrence of injury to arteries, nerves, tendons; incomplete relief of symptoms; dystrophy.  In the preoperative area, the patient was seen, the extremity marked by both patient and surgeon.  Antibiotic given.  DESCRIPTION OF PROCEDURE:  The patient was brought to the operating room, where a forearm-based IV regional anesthetic was carried out without difficulty.  She was prepped using ChloraPrep in a supine position with left arm free.  A 3-minute dry time was allowed, and a time-out taken, confirming the patient and procedure.  After adequate anesthesia was afforded, a transverse incision was made over the A1 pulley of the  left thumb and carried down through subcutaneous tissue. Bleeders were electrocauterized with bipolar.  Neurovascular structures were identified, protected with retractors radially and ulnarly.  The A1 pulley was identified, found to be markedly thickened.  This was released on its radial aspect.  The oblique pulley was left intact. Tenosynovial tissue proximally was separated with blunt dissection.  The thumb placed through a full range motion and no further triggering was noted.  The wound was irrigated and closed with interrupted 4-0 nylon sutures.  A separate incision was then made longitudinally in the left palm, carried down through subcutaneous tissue.  Bleeders were again electrocauterized with bipolar.  Palmar fascia was split.  Superficial palmar arch was identified.  The flexor tendon to the ring and little finger identified.  Retractors were placed retracting median nerve radially and the ulnar nerve ulnarly.  The flexor retinaculum was then released on its ulnar aspect.  A right angle and Sewell retractor were placed between the skin and forearm fascia.  The fascia released for approximately 3 cm proximal to the wrist crease under direct vision after dissecting any possible soft-tissue beneath it.  The canal was explored.  An area of compression to the nerve was  apparent.  Motor branch entered into muscle distally.  The wound was irrigated and closed with interrupted 4-0 nylon sutures.  A local infiltration to each wound was given, total of 10 mL of 0.25% bupivacaine without epinephrine.  A sterile compressive dressing with the fingers free was applied.  On deflation of the tourniquet, all fingers immediately pinked.  She was taken to the recovery room for observation in satisfactory condition. She will be discharged to home to return to the [place] in 1 week, on Ultram.          ______________________________ Daryll Brod, M.D.     GK/MEDQ  D:   08/29/2017  T:  08/29/2017  Job:  [id number]

## 2017-08-29 NOTE — Anesthesia Preprocedure Evaluation (Signed)
Anesthesia Evaluation  Patient identified by MRN, date of birth, ID band Patient awake    Reviewed: Allergy & Precautions, NPO status , Patient's Chart, lab work & pertinent test results  History of Anesthesia Complications (+) PONV  Airway Mallampati: II  TM Distance: >3 FB Neck ROM: Full    Dental no notable dental hx.    Pulmonary neg pulmonary ROS,    Pulmonary exam normal breath sounds clear to auscultation       Cardiovascular negative cardio ROS Normal cardiovascular exam Rhythm:Regular Rate:Normal     Neuro/Psych negative neurological ROS  negative psych ROS   GI/Hepatic negative GI ROS, Neg liver ROS,   Endo/Other  negative endocrine ROS  Renal/GU negative Renal ROS  negative genitourinary   Musculoskeletal negative musculoskeletal ROS (+)   Abdominal   Peds negative pediatric ROS (+)  Hematology negative hematology ROS (+)   Anesthesia Other Findings   Reproductive/Obstetrics negative OB ROS                            Anesthesia Physical Anesthesia Plan  ASA: II  Anesthesia Plan: Bier Block and Bier Block-LIDOCAINE ONLY   Post-op Pain Management:    Induction: Intravenous  PONV Risk Score and Plan: 3 and Ondansetron, Dexamethasone and Treatment may vary due to age or medical condition  Airway Management Planned: Simple Face Mask  Additional Equipment:   Intra-op Plan:   Post-operative Plan:   Informed Consent: I have reviewed the patients History and Physical, chart, labs and discussed the procedure including the risks, benefits and alternatives for the proposed anesthesia with the patient or authorized representative who has indicated his/her understanding and acceptance.   Dental advisory given  Plan Discussed with: CRNA  Anesthesia Plan Comments:         Anesthesia Quick Evaluation

## 2017-08-29 NOTE — H&P (Signed)
Traci Chavez is an 72 y.o. female.   Chief Complaint: numbness left hand catching left thumb HPI:Traci Chavez is a 72 year old right-hand-dominant female  complaining of numbness and tingling in her left arm. This been going approximately 2 years increasing over the past 3 months. She states she has pain at night and none numbness and tingling in the entire arm. She complains of burning pain with a VAS score up to 10/10. She has no history of injury to the hand or to the neck. She is awakened 3 out of to 4 out of 7 nights. She has tried taking Tylenol for discomfort. Is no history diabetes thyroid problems arthritis or gout. Family history is positive diabetes negative for thyroid problems arthritis and gout. She has been tested for diabetes. She thinks that she has right carpal tunnel syndrome. We release her right carpal tunnel syndrome approximately 12 years ago. Something seems to make it better or worse for her.She was scheduled for nerve conductions Dr. Thereasa Parkin. These have been performed revealing a carpal tunnel syndrome on her left side along with a C6 chronic radiculopathy.She has developed a stenosing tenosynovitis left thumb.  She had an injection to the thumb . She states the pain has disappeared but she cannot bend it.               Past Medical History:  Diagnosis Date  . Complication of anesthesia   . CTS (carpal tunnel syndrome)    left  . PONV (postoperative nausea and vomiting)   . Trigger thumb of left hand     Past Surgical History:  Procedure Laterality Date  . CARPAL TUNNEL RELEASE Right   . CLAVICLE SURGERY     fracture  . EYE SURGERY Bilateral    cataracts  . TUBAL LIGATION      History reviewed. No pertinent family history. Social History:  reports that  has never smoked. she has never used smokeless tobacco. She reports that she does not drink alcohol or use drugs.  Allergies:  Allergies  Allergen Reactions  . Claritin-D 12 Hour [Loratadine-Pseudoephedrine  Er] Other (See Comments)    BP severely elevated    No medications prior to admission.    No results found for this or any previous visit (from the past 48 hour(s)).  No results found.   Pertinent items are noted in HPI.  Height 5\' 4"  (1.626 m), weight 68 kg (150 lb).  General appearance: alert, cooperative and appears stated age Head: Normocephalic, without obvious abnormality Neck: no JVD Resp: clear to auscultation bilaterally Cardio: regular rate and rhythm, S1, S2 normal, no murmur, click, rub or gallop GI: soft, non-tender; bowel sounds normal; no masses,  no organomegaly Extremities: numbness left hand catching left thumb Pulses: 2+ and symmetric Skin: Skin color, texture, turgor normal. No rashes or lesions Neurologic: Grossly normal Incision/Wound: na  Assessment/Plan  Assessment:  1. Cervical spondylosis without myelopathy  2. Carpal tunnel syndrome of left wrist  3. Trigger finger of left thumb    Plan: Discussed possibility of a second injection to the thumb. She would like to just proceed to have this corrected. She would like to proceed with both carpal tunnel release release of the A1 pulley of the left thumb. Pre-peri-postoperative course have been discussed along with risk applications. She is where there is no guarantee to the surgery the possibility of infection recurrence injury to arteries nerves tendons complete relief symptoms dystrophy are all discussed with her. She is scheduled for release A1  pulley left thumb carpal tunnel release left hand as an outpatient under regional anesthesia.      Creek Gan R 08/29/2017, 6:14 AM

## 2017-08-30 ENCOUNTER — Encounter (HOSPITAL_BASED_OUTPATIENT_CLINIC_OR_DEPARTMENT_OTHER): Payer: Self-pay | Admitting: Orthopedic Surgery

## 2017-09-17 ENCOUNTER — Other Ambulatory Visit: Payer: Self-pay | Admitting: Internal Medicine

## 2017-09-17 DIAGNOSIS — Z1389 Encounter for screening for other disorder: Secondary | ICD-10-CM | POA: Diagnosis not present

## 2017-09-17 DIAGNOSIS — D509 Iron deficiency anemia, unspecified: Secondary | ICD-10-CM | POA: Diagnosis not present

## 2017-09-17 DIAGNOSIS — M899 Disorder of bone, unspecified: Secondary | ICD-10-CM | POA: Diagnosis not present

## 2017-09-17 DIAGNOSIS — Z0000 Encounter for general adult medical examination without abnormal findings: Secondary | ICD-10-CM | POA: Diagnosis not present

## 2017-09-17 DIAGNOSIS — Z1231 Encounter for screening mammogram for malignant neoplasm of breast: Secondary | ICD-10-CM

## 2017-09-17 DIAGNOSIS — M519 Unspecified thoracic, thoracolumbar and lumbosacral intervertebral disc disorder: Secondary | ICD-10-CM | POA: Diagnosis not present

## 2017-09-17 DIAGNOSIS — Z1211 Encounter for screening for malignant neoplasm of colon: Secondary | ICD-10-CM | POA: Diagnosis not present

## 2017-09-17 DIAGNOSIS — E7800 Pure hypercholesterolemia, unspecified: Secondary | ICD-10-CM | POA: Diagnosis not present

## 2017-10-04 ENCOUNTER — Ambulatory Visit
Admission: RE | Admit: 2017-10-04 | Discharge: 2017-10-04 | Disposition: A | Discharge status: Home / Self Care | Payer: Medicare Other | Source: Ambulatory Visit | Attending: Internal Medicine | Admitting: Internal Medicine

## 2017-10-04 DIAGNOSIS — Z1231 Encounter for screening mammogram for malignant neoplasm of breast: Secondary | ICD-10-CM | POA: Diagnosis not present

## 2018-02-13 DIAGNOSIS — L720 Epidermal cyst: Secondary | ICD-10-CM | POA: Diagnosis not present

## 2018-02-13 DIAGNOSIS — D225 Melanocytic nevi of trunk: Secondary | ICD-10-CM | POA: Diagnosis not present

## 2018-02-13 DIAGNOSIS — D2371 Other benign neoplasm of skin of right lower limb, including hip: Secondary | ICD-10-CM | POA: Diagnosis not present

## 2018-02-13 DIAGNOSIS — D2262 Melanocytic nevi of left upper limb, including shoulder: Secondary | ICD-10-CM | POA: Diagnosis not present

## 2018-02-13 DIAGNOSIS — D2271 Melanocytic nevi of right lower limb, including hip: Secondary | ICD-10-CM | POA: Diagnosis not present

## 2018-02-13 DIAGNOSIS — L821 Other seborrheic keratosis: Secondary | ICD-10-CM | POA: Diagnosis not present

## 2018-02-13 DIAGNOSIS — D2272 Melanocytic nevi of left lower limb, including hip: Secondary | ICD-10-CM | POA: Diagnosis not present

## 2018-02-13 DIAGNOSIS — L814 Other melanin hyperpigmentation: Secondary | ICD-10-CM | POA: Diagnosis not present

## 2018-02-13 DIAGNOSIS — Z85828 Personal history of other malignant neoplasm of skin: Secondary | ICD-10-CM | POA: Diagnosis not present

## 2018-02-13 DIAGNOSIS — D1801 Hemangioma of skin and subcutaneous tissue: Secondary | ICD-10-CM | POA: Diagnosis not present

## 2018-03-10 DIAGNOSIS — Z961 Presence of intraocular lens: Secondary | ICD-10-CM | POA: Diagnosis not present

## 2018-05-28 DIAGNOSIS — Z01411 Encounter for gynecological examination (general) (routine) with abnormal findings: Secondary | ICD-10-CM | POA: Diagnosis not present

## 2018-05-28 DIAGNOSIS — N952 Postmenopausal atrophic vaginitis: Secondary | ICD-10-CM | POA: Diagnosis not present

## 2018-10-08 DIAGNOSIS — M65341 Trigger finger, right ring finger: Secondary | ICD-10-CM | POA: Diagnosis not present

## 2018-10-08 DIAGNOSIS — M65322 Trigger finger, left index finger: Secondary | ICD-10-CM | POA: Diagnosis not present

## 2018-10-08 DIAGNOSIS — M65331 Trigger finger, right middle finger: Secondary | ICD-10-CM | POA: Diagnosis not present

## 2018-11-04 ENCOUNTER — Other Ambulatory Visit: Payer: Self-pay | Admitting: Internal Medicine

## 2018-11-04 DIAGNOSIS — Z1231 Encounter for screening mammogram for malignant neoplasm of breast: Secondary | ICD-10-CM

## 2018-11-12 DIAGNOSIS — M65331 Trigger finger, right middle finger: Secondary | ICD-10-CM | POA: Diagnosis not present

## 2018-11-16 DIAGNOSIS — L309 Dermatitis, unspecified: Secondary | ICD-10-CM | POA: Diagnosis not present

## 2018-11-28 ENCOUNTER — Ambulatory Visit: Payer: Medicare Other

## 2018-12-22 DIAGNOSIS — H26492 Other secondary cataract, left eye: Secondary | ICD-10-CM | POA: Diagnosis not present

## 2019-01-12 DIAGNOSIS — M65341 Trigger finger, right ring finger: Secondary | ICD-10-CM | POA: Diagnosis not present

## 2019-01-12 DIAGNOSIS — M65331 Trigger finger, right middle finger: Secondary | ICD-10-CM | POA: Diagnosis not present

## 2019-01-13 ENCOUNTER — Ambulatory Visit: Payer: Medicare Other

## 2019-01-14 ENCOUNTER — Encounter (HOSPITAL_BASED_OUTPATIENT_CLINIC_OR_DEPARTMENT_OTHER): Payer: Self-pay | Admitting: *Deleted

## 2019-01-14 ENCOUNTER — Other Ambulatory Visit: Payer: Self-pay

## 2019-01-14 DIAGNOSIS — H26492 Other secondary cataract, left eye: Secondary | ICD-10-CM | POA: Diagnosis not present

## 2019-01-20 ENCOUNTER — Other Ambulatory Visit (HOSPITAL_COMMUNITY)
Admission: RE | Admit: 2019-01-20 | Discharge: 2019-01-20 | Disposition: A | Discharge status: Home / Self Care | Payer: Medicare Other | Source: Ambulatory Visit | Attending: Orthopedic Surgery | Admitting: Orthopedic Surgery

## 2019-01-20 DIAGNOSIS — Z1159 Encounter for screening for other viral diseases: Secondary | ICD-10-CM | POA: Insufficient documentation

## 2019-01-20 LAB — SARS CORONAVIRUS 2 BY RT PCR (HOSPITAL ORDER, PERFORMED IN ~~LOC~~ HOSPITAL LAB): SARS Coronavirus 2: NEGATIVE

## 2019-01-21 ENCOUNTER — Other Ambulatory Visit: Payer: Self-pay | Admitting: Orthopedic Surgery

## 2019-01-22 ENCOUNTER — Ambulatory Visit (HOSPITAL_BASED_OUTPATIENT_CLINIC_OR_DEPARTMENT_OTHER): Payer: Medicare Other | Admitting: Anesthesiology

## 2019-01-22 ENCOUNTER — Encounter (HOSPITAL_BASED_OUTPATIENT_CLINIC_OR_DEPARTMENT_OTHER)
Admission: RE | Disposition: A | Discharge status: Home / Self Care | Payer: Self-pay | Source: Home / Self Care | Attending: Orthopedic Surgery

## 2019-01-22 ENCOUNTER — Encounter (HOSPITAL_BASED_OUTPATIENT_CLINIC_OR_DEPARTMENT_OTHER): Payer: Self-pay | Admitting: Emergency Medicine

## 2019-01-22 ENCOUNTER — Ambulatory Visit (HOSPITAL_BASED_OUTPATIENT_CLINIC_OR_DEPARTMENT_OTHER)
Admission: RE | Admit: 2019-01-22 | Discharge: 2019-01-22 | Disposition: A | Discharge status: Home / Self Care | Payer: Medicare Other | Attending: Orthopedic Surgery | Admitting: Orthopedic Surgery

## 2019-01-22 ENCOUNTER — Other Ambulatory Visit: Payer: Self-pay

## 2019-01-22 DIAGNOSIS — M65841 Other synovitis and tenosynovitis, right hand: Secondary | ICD-10-CM | POA: Diagnosis not present

## 2019-01-22 DIAGNOSIS — M65341 Trigger finger, right ring finger: Secondary | ICD-10-CM | POA: Diagnosis not present

## 2019-01-22 DIAGNOSIS — M65331 Trigger finger, right middle finger: Secondary | ICD-10-CM | POA: Insufficient documentation

## 2019-01-22 DIAGNOSIS — G5602 Carpal tunnel syndrome, left upper limb: Secondary | ICD-10-CM | POA: Insufficient documentation

## 2019-01-22 DIAGNOSIS — Z1159 Encounter for screening for other viral diseases: Secondary | ICD-10-CM | POA: Insufficient documentation

## 2019-01-22 HISTORY — PX: TRIGGER FINGER RELEASE: SHX641

## 2019-01-22 SURGERY — RELEASE, A1 PULLEY, FOR TRIGGER FINGER
Anesthesia: Regional | Site: Hand | Laterality: Right

## 2019-01-22 MED ORDER — FENTANYL CITRATE (PF) 100 MCG/2ML IJ SOLN
INTRAMUSCULAR | Status: AC
  Filled 2019-01-22: qty 2

## 2019-01-22 MED ORDER — SUCCINYLCHOLINE CHLORIDE 200 MG/10ML IV SOSY
PREFILLED_SYRINGE | INTRAVENOUS | Status: AC
  Filled 2019-01-22: qty 10

## 2019-01-22 MED ORDER — SCOPOLAMINE 1 MG/3DAYS TD PT72: 1.0000 | MEDICATED_PATCH | Freq: Once | TRANSDERMAL | Status: DC | PRN

## 2019-01-22 MED ORDER — MIDAZOLAM HCL 2 MG/2ML IJ SOLN
1.0000 mg | INTRAMUSCULAR | Status: DC | PRN
  Administered 2019-01-22: 10:00:00 1 mg via INTRAVENOUS

## 2019-01-22 MED ORDER — PROPOFOL 10 MG/ML IV BOLUS
INTRAVENOUS | Status: DC | PRN
  Administered 2019-01-22: 20 mg via INTRAVENOUS

## 2019-01-22 MED ORDER — FENTANYL CITRATE (PF) 100 MCG/2ML IJ SOLN: 25.0000 ug | INTRAMUSCULAR | Status: DC | PRN

## 2019-01-22 MED ORDER — CHLORHEXIDINE GLUCONATE 4 % EX LIQD: 60.0000 mL | Freq: Once | CUTANEOUS | Status: DC

## 2019-01-22 MED ORDER — FENTANYL CITRATE (PF) 100 MCG/2ML IJ SOLN
50.0000 ug | INTRAMUSCULAR | Status: DC | PRN
  Administered 2019-01-22: 10:00:00 50 ug via INTRAVENOUS

## 2019-01-22 MED ORDER — EPHEDRINE 5 MG/ML INJ
INTRAVENOUS | Status: AC
  Filled 2019-01-22: qty 10

## 2019-01-22 MED ORDER — ONDANSETRON HCL 4 MG/2ML IJ SOLN
INTRAMUSCULAR | Status: AC
  Filled 2019-01-22: qty 2

## 2019-01-22 MED ORDER — METOCLOPRAMIDE HCL 5 MG/ML IJ SOLN: 10.0000 mg | Freq: Once | INTRAMUSCULAR | Status: DC | PRN

## 2019-01-22 MED ORDER — CEFAZOLIN SODIUM-DEXTROSE 2-4 GM/100ML-% IV SOLN
INTRAVENOUS | Status: AC
  Filled 2019-01-22: qty 100

## 2019-01-22 MED ORDER — ONDANSETRON HCL 4 MG/2ML IJ SOLN
INTRAMUSCULAR | Status: DC | PRN
  Administered 2019-01-22: 4 mg via INTRAVENOUS

## 2019-01-22 MED ORDER — PROPOFOL 500 MG/50ML IV EMUL
INTRAVENOUS | Status: AC
  Filled 2019-01-22: qty 50

## 2019-01-22 MED ORDER — BUPIVACAINE HCL (PF) 0.25 % IJ SOLN
INTRAMUSCULAR | Status: DC | PRN
  Administered 2019-01-22: 8 mL

## 2019-01-22 MED ORDER — LACTATED RINGERS IV SOLN
INTRAVENOUS | Status: DC
  Administered 2019-01-22: 09:00:00 via INTRAVENOUS

## 2019-01-22 MED ORDER — CEFAZOLIN SODIUM-DEXTROSE 2-4 GM/100ML-% IV SOLN
2.0000 g | INTRAVENOUS | Status: AC
  Administered 2019-01-22 (×2): 2 g via INTRAVENOUS

## 2019-01-22 MED ORDER — MIDAZOLAM HCL 2 MG/2ML IJ SOLN
INTRAMUSCULAR | Status: AC
  Filled 2019-01-22: qty 2

## 2019-01-22 MED ORDER — MEPERIDINE HCL 25 MG/ML IJ SOLN: 6.2500 mg | INTRAMUSCULAR | Status: DC | PRN

## 2019-01-22 MED ORDER — PHENYLEPHRINE 40 MCG/ML (10ML) SYRINGE FOR IV PUSH (FOR BLOOD PRESSURE SUPPORT)
PREFILLED_SYRINGE | INTRAVENOUS | Status: AC
  Filled 2019-01-22: qty 10

## 2019-01-22 SURGICAL SUPPLY — 33 items
APL PRP STRL LF DISP 70% ISPRP (MISCELLANEOUS) ×1
BLADE SURG 15 STRL LF DISP TIS (BLADE) ×1 IMPLANT
BLADE SURG 15 STRL SS (BLADE) ×3
BNDG CMPR 9X4 STRL LF SNTH (GAUZE/BANDAGES/DRESSINGS) ×1
BNDG COHESIVE 2X5 TAN STRL LF (GAUZE/BANDAGES/DRESSINGS) ×3 IMPLANT
BNDG ESMARK 4X9 LF (GAUZE/BANDAGES/DRESSINGS) ×2 IMPLANT
CHLORAPREP W/TINT 26 (MISCELLANEOUS) ×3 IMPLANT
CORD BIPOLAR FORCEPS 12FT (ELECTRODE) ×2 IMPLANT
COVER BACK TABLE REUSABLE LG (DRAPES) ×3 IMPLANT
COVER MAYO STAND REUSABLE (DRAPES) ×3 IMPLANT
COVER WAND RF STERILE (DRAPES) IMPLANT
CUFF TOURN SGL QUICK 18X4 (TOURNIQUET CUFF) ×2 IMPLANT
DECANTER SPIKE VIAL GLASS SM (MISCELLANEOUS) IMPLANT
DRAPE EXTREMITY T 121X128X90 (DISPOSABLE) ×3 IMPLANT
DRAPE SURG 17X23 STRL (DRAPES) ×3 IMPLANT
GAUZE SPONGE 4X4 12PLY STRL (GAUZE/BANDAGES/DRESSINGS) ×3 IMPLANT
GAUZE XEROFORM 1X8 LF (GAUZE/BANDAGES/DRESSINGS) ×3 IMPLANT
GLOVE BIOGEL PI IND STRL 8.5 (GLOVE) ×1 IMPLANT
GLOVE BIOGEL PI INDICATOR 8.5 (GLOVE) ×2
GLOVE SURG ORTHO 8.0 STRL STRW (GLOVE) ×3 IMPLANT
GOWN STRL REUS W/ TWL LRG LVL3 (GOWN DISPOSABLE) ×1 IMPLANT
GOWN STRL REUS W/TWL LRG LVL3 (GOWN DISPOSABLE) ×3
GOWN STRL REUS W/TWL XL LVL3 (GOWN DISPOSABLE) ×3 IMPLANT
NDL PRECISIONGLIDE 27X1.5 (NEEDLE) ×1 IMPLANT
NEEDLE PRECISIONGLIDE 27X1.5 (NEEDLE) ×3 IMPLANT
NS IRRIG 1000ML POUR BTL (IV SOLUTION) ×3 IMPLANT
PACK BASIN DAY SURGERY FS (CUSTOM PROCEDURE TRAY) ×3 IMPLANT
STOCKINETTE 4X48 STRL (DRAPES) ×3 IMPLANT
SUT ETHILON 4 0 PS 2 18 (SUTURE) ×3 IMPLANT
SYR BULB 3OZ (MISCELLANEOUS) ×3 IMPLANT
SYR CONTROL 10ML LL (SYRINGE) ×3 IMPLANT
TOWEL GREEN STERILE FF (TOWEL DISPOSABLE) ×4 IMPLANT
UNDERPAD 30X30 (UNDERPADS AND DIAPERS) ×3 IMPLANT

## 2019-01-22 NOTE — Anesthesia Preprocedure Evaluation (Signed)
Anesthesia Evaluation  Patient identified by MRN, date of birth, ID band Patient awake    Reviewed: Allergy & Precautions, NPO status , Patient's Chart, lab work & pertinent test results  History of Anesthesia Complications (+) PONV  Airway Mallampati: II  TM Distance: >3 FB Neck ROM: Full    Dental no notable dental hx.    Pulmonary neg pulmonary ROS,    Pulmonary exam normal breath sounds clear to auscultation       Cardiovascular negative cardio ROS Normal cardiovascular exam Rhythm:Regular Rate:Normal     Neuro/Psych negative neurological ROS  negative psych ROS   GI/Hepatic negative GI ROS, Neg liver ROS,   Endo/Other  negative endocrine ROS  Renal/GU negative Renal ROS  negative genitourinary   Musculoskeletal negative musculoskeletal ROS (+)   Abdominal   Peds negative pediatric ROS (+)  Hematology negative hematology ROS (+)   Anesthesia Other Findings   Reproductive/Obstetrics negative OB ROS                            Anesthesia Physical Anesthesia Plan  ASA: II  Anesthesia Plan: Bier Block and Bier Block-LIDOCAINE ONLY   Post-op Pain Management:    Induction:   PONV Risk Score and Plan: 3 and Treatment may vary due to age or medical condition  Airway Management Planned: Simple Face Mask and Nasal Cannula  Additional Equipment:   Intra-op Plan:   Post-operative Plan:   Informed Consent: I have reviewed the patients History and Physical, chart, labs and discussed the procedure including the risks, benefits and alternatives for the proposed anesthesia with the patient or authorized representative who has indicated his/her understanding and acceptance.     Dental advisory given  Plan Discussed with: CRNA  Anesthesia Plan Comments:         Anesthesia Quick Evaluation

## 2019-01-22 NOTE — Transfer of Care (Signed)
Immediate Anesthesia Transfer of Care Note  Patient: Traci Chavez  Procedure(s) Performed: RELEASE TRIGGER FINGER/A-1 PULLEY RIGHT MIDDLE FINGER (Right Hand)  Patient Location: PACU  Anesthesia Type:MAC and Bier block  Level of Consciousness: awake, alert  and oriented  Airway & Oxygen Therapy: Patient Spontanous Breathing and Patient connected to face mask oxygen  Post-op Assessment: Report given to RN and Post -op Vital signs reviewed and stable  Post vital signs: Reviewed and stable  Last Vitals:  Vitals Value Taken Time  BP    Temp    Pulse    Resp    SpO2      Last Pain:  Vitals:   01/22/19 0820  TempSrc: Oral  PainSc: 0-No pain         Complications: No apparent anesthesia complications

## 2019-01-22 NOTE — Anesthesia Postprocedure Evaluation (Signed)
Anesthesia Post Note  Patient: Traci Chavez  Procedure(s) Performed: RELEASE TRIGGER FINGER/A-1 PULLEY RIGHT MIDDLE FINGER (Right Hand)     Patient location during evaluation: PACU Anesthesia Type: Bier Block Level of consciousness: awake and alert Pain management: pain level controlled Vital Signs Assessment: post-procedure vital signs reviewed and stable Respiratory status: spontaneous breathing, nonlabored ventilation, respiratory function stable and patient connected to nasal cannula oxygen Cardiovascular status: stable and blood pressure returned to baseline Postop Assessment: no apparent nausea or vomiting Anesthetic complications: no    Last Vitals:  Vitals:   01/22/19 0820 01/22/19 1010  BP: 108/64 105/67  Pulse: 65 (!) 54  Resp: 16 12  Temp: 36.8 C 36.6 C  SpO2: 100% 100%    Last Pain:  Vitals:   01/22/19 1010  TempSrc:   PainSc: 0-No pain                 Montez Hageman

## 2019-01-22 NOTE — Brief Op Note (Signed)
01/22/2019  10:03 AM  PATIENT:  Traci Chavez  73 y.o. female  PRE-OPERATIVE DIAGNOSIS:  TRIGGER FINGER RIGHT MIDDLE FINGER  POST-OPERATIVE DIAGNOSIS:  TRIGGER FINGER RIGHT MIDDLE FINGER  PROCEDURE:  Procedure(s) with comments: RELEASE TRIGGER FINGER/A-1 PULLEY RIGHT MIDDLE FINGER (Right) - FAB  SURGEON:  Surgeon(s) and Role:    * Daryll Brod, MD - Primary  PHYSICIAN ASSISTANT:   ASSISTANTS: none   ANESTHESIA:   local, regional and IV sedation  EBL:  2 mL   BLOOD ADMINISTERED:none  DRAINS: none   LOCAL MEDICATIONS USED:  BUPIVICAINE   SPECIMEN:  No Specimen  DISPOSITION OF SPECIMEN:  N/A  COUNTS:  YES  TOURNIQUET:   Total Tourniquet Time Documented: Forearm (Right) - 23 minutes Total: Forearm (Right) - 23 minutes   DICTATION: .Viviann Spare Dictation  PLAN OF CARE: Discharge to home after PACU  PATIENT DISPOSITION:  PACU - hemodynamically stable.

## 2019-01-22 NOTE — H&P (Signed)
Traci Chavez is an 73 y.o. female.   Chief Complaint: catching right middle and ring fingersHPI: Traci Chavez is a 73 yo female with triggering right ring finger. She also has triggering of her left ring finger. . She had it has had 2 injections to the right middle finger. She states the right ring finger is beginning to catch. She does not know exactly when this began. She is complaining of mild to moderate discomfort when it catches with some discomfort going up her arm this at the metacarpal phalangeal joint to the right middle right ring finger. She states left index finger has resolved. He is not complaining of any numbness or tingling. She is scheduled for eye surgery. She has no history of diabetes thyroid problems arthritis or gout.. Family history is positive diabetes negative for the remainder. He has undergone carpal tunnel release in the past.    Past Medical History:  Diagnosis Date  . Complication of anesthesia   . CTS (carpal tunnel syndrome)    left  . PONV (postoperative nausea and vomiting)   . Trigger thumb of left hand     Past Surgical History:  Procedure Laterality Date  . CARPAL TUNNEL RELEASE Right   . CARPAL TUNNEL RELEASE Left 08/29/2017   Procedure: LEFT CARPAL TUNNEL RELEASE;  Surgeon: Daryll Brod, MD;  Location: [place];  Service: Orthopedics;  Laterality: Left;  . CLAVICLE SURGERY     fracture  . EYE SURGERY Bilateral    cataracts  . TRIGGER FINGER RELEASE Left 08/29/2017   Procedure: LEFT RELEASE TRIGGER FINGER/A-1 PULLEY;  Surgeon: Daryll Brod, MD;  Location: [facility];  Service: Orthopedics;  Laterality: Left;  . TUBAL LIGATION      History reviewed. No pertinent family history. Social History:  reports that she has never smoked. She has never used smokeless tobacco. She reports that she does not drink alcohol or use drugs.  Allergies:  Allergies  Allergen Reactions  . Claritin-D 12 Hour [Loratadine-Pseudoephedrine Er] Other (See  Comments)    BP severely elevated  . Vicodin [Hydrocodone-Acetaminophen] Nausea Only    No medications prior to admission.    Results for orders placed or performed during the hospital encounter of 01/20/19 (from the past 48 hour(s))  SARS Coronavirus 2 (CEPHEID - Performed in [facility] hospital lab), Hosp Order     Status: None   Collection Time: 01/20/19  8:50 AM  Result Value Ref Range   SARS Coronavirus 2 NEGATIVE NEGATIVE    Comment: (NOTE) If result is NEGATIVE SARS-CoV-2 target nucleic acids are NOT DETECTED. The SARS-CoV-2 RNA is generally detectable in upper and lower  respiratory specimens during the acute phase of infection. The lowest  concentration of SARS-CoV-2 viral copies this assay can detect is 250  copies / mL. A negative result does not preclude SARS-CoV-2 infection  and should not be used as the sole basis for treatment or other  patient management decisions.  A negative result may occur with  improper specimen collection / handling, submission of specimen other  than nasopharyngeal swab, presence of viral mutation(s) within the  areas targeted by this assay, and inadequate number of viral copies  (<250 copies / mL). A negative result must be combined with clinical  observations, patient history, and epidemiological information. If result is POSITIVE SARS-CoV-2 target nucleic acids are DETECTED. The SARS-CoV-2 RNA is generally detectable in upper and lower  respiratory specimens dur ing the acute phase of infection.  Positive  results are indicative of active infection with SARS-CoV-2.  Clinical  correlation with patient history and other diagnostic information is  necessary to determine patient infection status.  Positive results do  not rule out bacterial infection or co-infection with other viruses. If result is PRESUMPTIVE POSTIVE SARS-CoV-2 nucleic acids MAY BE PRESENT.   A presumptive positive result was obtained on the submitted specimen  and  confirmed on repeat testing.  While 2019 novel coronavirus  (SARS-CoV-2) nucleic acids may be present in the submitted sample  additional confirmatory testing may be necessary for epidemiological  and / or clinical management purposes  to differentiate between  SARS-CoV-2 and other Sarbecovirus currently known to infect humans.  If clinically indicated additional testing with an alternate test  methodology [phone]) is advised. The SARS-CoV-2 RNA is generally  detectable in upper and lower respiratory sp ecimens during the acute  phase of infection. The expected result is Negative. Fact Sheet for Patients:  StrictlyIdeas.no Fact Sheet for Healthcare Providers: BankingDealers.co.za This test is not yet approved or cleared by the [place] FDA and has been authorized for detection and/or diagnosis of SARS-CoV-2 by FDA under an Emergency Use Authorization (EUA).  This EUA will remain in effect (meaning this test can be used) for the duration of the COVID-19 declaration under Section 564(b)(1) of the Act, 21 U.S.C. section 360bbb-3(b)(1), unless the authorization is terminated or revoked sooner. Performed at [facility], [place] [address] [place], [place] [address]     No results found.   Pertinent items are noted in HPI.  Height 5\' 3"  (1.6 m), weight 68 kg.  General appearance: alert, cooperative and appears stated age Head: Normocephalic, without obvious abnormality Neck: no JVD Resp: clear to auscultation bilaterally Cardio: regular rate and rhythm, S1, S2 normal, no murmur, click, rub or gallop GI: soft, non-tender; bowel sounds normal; no masses,  no organomegaly Extremities:  catching right middle and ring fingers Pulses: 2+ and symmetric Skin: Skin color, texture, turgor normal. No rashes or lesions Neurologic: Grossly normal Incision/Wound: na  Assessment/Plan Assessment:  1. Trigger middle finger  of right hand  2. Trigger finger, right ring finger    Plan: We have discussed surgical release of both A1 pulleys right middle right ring fingers as an outpatient under regional anesthesia. Pre-peri-and postoperative course been discussed along with risks and complications. She is aware that there is no guarantee to the surgery the possibility of infection recurrence injury to arteries nerves tendons complete relief symptoms and dystrophy. She is scheduled for release A1 pulley right middle right ring finger as an outpatient under regional    Daryll Brod 01/22/2019, 4:58 AM

## 2019-01-22 NOTE — Op Note (Signed)
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   ASSISTANT: none   ANESTHESIA:  Bier block with sedation and Local   INTRAVENOUS FLUIDS:  Per anesthesia flow sheet.   ESTIMATED BLOOD LOSS:  Minimal.   COMPLICATIONS:  None.   SPECIMENS:  none   TOURNIQUET TIME:    Total Tourniquet Time Documented: Forearm (Right) - 23 minutes Total: Forearm (Right) - 23 minutes    DISPOSITION:  Stable to PACU.   INDICATIONS: Patient is a 73 year old female with a history of triggering of her right right middle and ring fingers which has not responded to conservative treatment.  She has elected undergo release of the A1 pulleys of those 2 digit digits.  Pre-peri-and postoperative course been discussed along with risks and complications.  She is aware that there is no guarantee to the surgery the possibility of infection recurrence injury to arteries nerves tendons and complete relief symptoms and dystrophy.  In the preoperative area the patient has seen the extremity marked by both patient and surgeon antibiotic given  OPERATIVE COURSE: Patient is brought to the operating room where form based IV regional anesthetic was carried out without difficulty.  She was prepped using ChloraPrep in a supine position with the right arm free.  A three-minute dry time was allowed and a timeout taken to confirm patient procedure.  An oblique incision was made over the metacarpal phalangeal joint crease area of her right middle finger carried down through subcutaneous tissue.  Bleeders were electrocauterized with bipolar.  Neurovascular  structures were protected with retractors.  The A1 pulley was found to be markedly thickened and released on its radial aspect a small incision made centrally and A2 partial tenosynovectomy performed proximally.  The 2 flexor tendons separated to to break any adhesions.  The finger was placed through full passive range of motion no further triggering was noted.  An oblique incision was then made over the metacarpal phalangeal joint area of her ring finger right hand.  The dissection was carried down through subcutaneous tissue and bleeders were electrocauterized with bipolar as necessary.  Tractors were placed protecting neurovascular bundles radially and ulnarly.  The A1 pulley was identified released on its radial aspect a small incision made centrally and A2.  An abundant Tina's and synovial tissue mass was present proximally this was debrided the tendon separated the finger placed through a full passive range of motion no further triggering was noted.  The 2 wounds were then copiously irrigated with saline and closed interrupted 4-0 nylon sutures.  Local infiltrate traction with quarter percent bupivacaine without epinephrine was given approximately 8 cc was used a sterile compressive dressing with fingers 3 was applied.  On deflation of the tourniquet all fingers immediately pink.  She was taken to the recovery room for observation in satisfactory condition.  She will be discharged home to return the hand center of [facility] in 1 week on Tylenol with ibuprofen.  She has Ultram to take for breakthrough at home.   Daryll Brod, MD Electronically signed, 01/22/19

## 2019-01-22 NOTE — Discharge Instructions (Addendum)

## 2019-01-23 ENCOUNTER — Encounter (HOSPITAL_BASED_OUTPATIENT_CLINIC_OR_DEPARTMENT_OTHER): Payer: Self-pay | Admitting: Orthopedic Surgery

## 2019-01-23 NOTE — Addendum Note (Signed)
Addendum  created 01/23/19 6734 by Cassandr Cederberg, Ernesta Amble, CRNA   Charge Capture section accepted

## 2019-02-25 ENCOUNTER — Other Ambulatory Visit: Payer: Self-pay

## 2019-02-25 ENCOUNTER — Ambulatory Visit
Admission: RE | Admit: 2019-02-25 | Discharge: 2019-02-25 | Disposition: A | Discharge status: Home / Self Care | Payer: Medicare Other | Source: Ambulatory Visit | Attending: Internal Medicine | Admitting: Internal Medicine

## 2019-02-25 DIAGNOSIS — Z1231 Encounter for screening mammogram for malignant neoplasm of breast: Secondary | ICD-10-CM | POA: Diagnosis not present

## 2019-03-11 DIAGNOSIS — H26491 Other secondary cataract, right eye: Secondary | ICD-10-CM | POA: Diagnosis not present

## 2019-03-11 DIAGNOSIS — H524 Presbyopia: Secondary | ICD-10-CM | POA: Diagnosis not present

## 2019-03-11 DIAGNOSIS — Z961 Presence of intraocular lens: Secondary | ICD-10-CM | POA: Diagnosis not present

## 2019-03-11 DIAGNOSIS — H52203 Unspecified astigmatism, bilateral: Secondary | ICD-10-CM | POA: Diagnosis not present

## 2019-03-25 DIAGNOSIS — D225 Melanocytic nevi of trunk: Secondary | ICD-10-CM | POA: Diagnosis not present

## 2019-03-25 DIAGNOSIS — D485 Neoplasm of uncertain behavior of skin: Secondary | ICD-10-CM | POA: Diagnosis not present

## 2019-03-25 DIAGNOSIS — Z85828 Personal history of other malignant neoplasm of skin: Secondary | ICD-10-CM | POA: Diagnosis not present

## 2019-03-25 DIAGNOSIS — D2262 Melanocytic nevi of left upper limb, including shoulder: Secondary | ICD-10-CM | POA: Diagnosis not present

## 2019-03-25 DIAGNOSIS — D1801 Hemangioma of skin and subcutaneous tissue: Secondary | ICD-10-CM | POA: Diagnosis not present

## 2019-03-25 DIAGNOSIS — D2371 Other benign neoplasm of skin of right lower limb, including hip: Secondary | ICD-10-CM | POA: Diagnosis not present

## 2019-03-25 DIAGNOSIS — L821 Other seborrheic keratosis: Secondary | ICD-10-CM | POA: Diagnosis not present

## 2019-03-25 DIAGNOSIS — L814 Other melanin hyperpigmentation: Secondary | ICD-10-CM | POA: Diagnosis not present

## 2019-05-22 ENCOUNTER — Emergency Department (HOSPITAL_COMMUNITY): Payer: Medicare Other | Admitting: Certified Registered Nurse Anesthetist

## 2019-05-22 ENCOUNTER — Encounter (HOSPITAL_COMMUNITY): Payer: Self-pay

## 2019-05-22 ENCOUNTER — Encounter (HOSPITAL_COMMUNITY)
Admission: EM | Disposition: A | Discharge status: Home / Self Care | Payer: Self-pay | Source: Home / Self Care | Attending: Emergency Medicine

## 2019-05-22 ENCOUNTER — Ambulatory Visit (HOSPITAL_COMMUNITY)
Admission: EM | Admit: 2019-05-22 | Discharge: 2019-05-23 | Disposition: A | Discharge status: Home / Self Care | Payer: Medicare Other | Attending: Emergency Medicine | Admitting: Emergency Medicine

## 2019-05-22 ENCOUNTER — Emergency Department (HOSPITAL_COMMUNITY): Payer: Medicare Other

## 2019-05-22 ENCOUNTER — Other Ambulatory Visit: Payer: Self-pay

## 2019-05-22 DIAGNOSIS — G709 Myoneural disorder, unspecified: Secondary | ICD-10-CM | POA: Diagnosis not present

## 2019-05-22 DIAGNOSIS — Z888 Allergy status to other drugs, medicaments and biological substances status: Secondary | ICD-10-CM | POA: Diagnosis not present

## 2019-05-22 DIAGNOSIS — W19XXXA Unspecified fall, initial encounter: Secondary | ICD-10-CM

## 2019-05-22 DIAGNOSIS — Z885 Allergy status to narcotic agent status: Secondary | ICD-10-CM | POA: Diagnosis not present

## 2019-05-22 DIAGNOSIS — S0181XA Laceration without foreign body of other part of head, initial encounter: Secondary | ICD-10-CM | POA: Diagnosis not present

## 2019-05-22 DIAGNOSIS — S02401B Maxillary fracture, unspecified, initial encounter for open fracture: Secondary | ICD-10-CM

## 2019-05-22 DIAGNOSIS — S0992XA Unspecified injury of nose, initial encounter: Secondary | ICD-10-CM | POA: Diagnosis not present

## 2019-05-22 DIAGNOSIS — W01198A Fall on same level from slipping, tripping and stumbling with subsequent striking against other object, initial encounter: Secondary | ICD-10-CM | POA: Insufficient documentation

## 2019-05-22 DIAGNOSIS — Z20828 Contact with and (suspected) exposure to other viral communicable diseases: Secondary | ICD-10-CM | POA: Insufficient documentation

## 2019-05-22 DIAGNOSIS — S0121XA Laceration without foreign body of nose, initial encounter: Secondary | ICD-10-CM | POA: Diagnosis not present

## 2019-05-22 DIAGNOSIS — S01511A Laceration without foreign body of lip, initial encounter: Secondary | ICD-10-CM | POA: Diagnosis not present

## 2019-05-22 DIAGNOSIS — S0191XA Laceration without foreign body of unspecified part of head, initial encounter: Secondary | ICD-10-CM | POA: Diagnosis not present

## 2019-05-22 DIAGNOSIS — S0282XA Fracture of other specified skull and facial bones, left side, initial encounter for closed fracture: Secondary | ICD-10-CM | POA: Diagnosis not present

## 2019-05-22 DIAGNOSIS — S0083XA Contusion of other part of head, initial encounter: Secondary | ICD-10-CM | POA: Diagnosis not present

## 2019-05-22 HISTORY — PX: FACIAL LACERATION REPAIR: SHX6589

## 2019-05-22 LAB — SARS CORONAVIRUS 2 BY RT PCR (HOSPITAL ORDER, PERFORMED IN ~~LOC~~ HOSPITAL LAB): SARS Coronavirus 2: NEGATIVE

## 2019-05-22 LAB — CBC
HCT: 42.5 % (ref 36.0–46.0)
Hemoglobin: 14.5 g/dL (ref 12.0–15.0)
MCH: 32.8 pg (ref 26.0–34.0)
MCHC: 34.1 g/dL (ref 30.0–36.0)
MCV: 96.2 fL (ref 80.0–100.0)
Platelets: 212 10*3/uL (ref 150–400)
RBC: 4.42 MIL/uL (ref 3.87–5.11)
RDW: 12.4 % (ref 11.5–15.5)
WBC: 10.9 10*3/uL — ABNORMAL HIGH (ref 4.0–10.5)
nRBC: 0 % (ref 0.0–0.2)

## 2019-05-22 LAB — BASIC METABOLIC PANEL
Anion gap: 10 (ref 5–15)
BUN: 18 mg/dL (ref 8–23)
CO2: 22 mmol/L (ref 22–32)
Calcium: 9.3 mg/dL (ref 8.9–10.3)
Chloride: 105 mmol/L (ref 98–111)
Creatinine, Ser: 0.8 mg/dL (ref 0.44–1.00)
GFR calc Af Amer: >60 mL/min (ref >60)
GFR calc non Af Amer: >60 mL/min (ref >60)
Glucose, Bld: 112 mg/dL — ABNORMAL HIGH (ref 70–99)
Potassium: 3.8 mmol/L (ref 3.5–5.1)
Sodium: 137 mmol/L (ref 135–145)

## 2019-05-22 SURGERY — REPAIR, LACERATION, FACE
Anesthesia: General

## 2019-05-22 MED ORDER — LIDOCAINE-EPINEPHRINE (PF) 1 %-1:200000 IJ SOLN
INTRAMUSCULAR | Status: AC
  Filled 2019-05-22: qty 30

## 2019-05-22 MED ORDER — LACTATED RINGERS IV SOLN
INTRAVENOUS | Status: DC | PRN
  Administered 2019-05-22: 23:00:00 via INTRAVENOUS

## 2019-05-22 MED ORDER — SUCCINYLCHOLINE CHLORIDE 20 MG/ML IJ SOLN
INTRAMUSCULAR | Status: DC | PRN
  Administered 2019-05-22: 100 mg via INTRAVENOUS

## 2019-05-22 MED ORDER — OXYCODONE HCL 5 MG/5ML PO SOLN: 5.0000 mg | Freq: Once | ORAL | Status: AC | PRN

## 2019-05-22 MED ORDER — FENTANYL CITRATE (PF) 250 MCG/5ML IJ SOLN
INTRAMUSCULAR | Status: AC
  Filled 2019-05-22: qty 5

## 2019-05-22 MED ORDER — SCOPOLAMINE 1 MG/3DAYS TD PT72
MEDICATED_PATCH | TRANSDERMAL | Status: AC
  Filled 2019-05-22: qty 1

## 2019-05-22 MED ORDER — PROPOFOL 10 MG/ML IV BOLUS
INTRAVENOUS | Status: AC
  Filled 2019-05-22: qty 20

## 2019-05-22 MED ORDER — CEFAZOLIN SODIUM-DEXTROSE 2-4 GM/100ML-% IV SOLN
INTRAVENOUS | Status: AC
  Filled 2019-05-22: qty 100

## 2019-05-22 MED ORDER — CEFAZOLIN SODIUM-DEXTROSE 1-4 GM/50ML-% IV SOLN
1.0000 g | Freq: Once | INTRAVENOUS | Status: AC
  Administered 2019-05-22: 1 g via INTRAVENOUS
  Filled 2019-05-22: qty 50

## 2019-05-22 MED ORDER — FENTANYL CITRATE (PF) 100 MCG/2ML IJ SOLN
25.0000 ug | INTRAMUSCULAR | Status: DC | PRN
  Administered 2019-05-23 (×2): 50 ug via INTRAVENOUS

## 2019-05-22 MED ORDER — LIDOCAINE HCL (CARDIAC) PF 100 MG/5ML IV SOSY
PREFILLED_SYRINGE | INTRAVENOUS | Status: DC | PRN
  Administered 2019-05-22: 50 mg via INTRATRACHEAL

## 2019-05-22 MED ORDER — OXYCODONE HCL 5 MG PO TABS
5.0000 mg | ORAL_TABLET | Freq: Once | ORAL | Status: AC | PRN
  Administered 2019-05-23: 5 mg via ORAL

## 2019-05-22 MED ORDER — 0.9 % SODIUM CHLORIDE (POUR BTL) OPTIME
TOPICAL | Status: DC | PRN
  Administered 2019-05-22: 1000 mL

## 2019-05-22 MED ORDER — DEXAMETHASONE SODIUM PHOSPHATE 10 MG/ML IJ SOLN
INTRAMUSCULAR | Status: DC | PRN
  Administered 2019-05-22: 10 mg via INTRAVENOUS

## 2019-05-22 MED ORDER — ONDANSETRON HCL 4 MG/2ML IJ SOLN
4.0000 mg | Freq: Four times a day (QID) | INTRAMUSCULAR | Status: AC | PRN
  Administered 2019-05-23: 4 mg via INTRAVENOUS

## 2019-05-22 MED ORDER — MIDAZOLAM HCL 2 MG/2ML IJ SOLN
INTRAMUSCULAR | Status: DC | PRN
  Administered 2019-05-22: 1 mg via INTRAVENOUS

## 2019-05-22 MED ORDER — MIDAZOLAM HCL 2 MG/2ML IJ SOLN
INTRAMUSCULAR | Status: AC
  Filled 2019-05-22: qty 2

## 2019-05-22 MED ORDER — BACITRACIN ZINC 500 UNIT/GM EX OINT
TOPICAL_OINTMENT | CUTANEOUS | Status: AC
  Filled 2019-05-22: qty 28.35

## 2019-05-22 MED ORDER — FENTANYL CITRATE (PF) 250 MCG/5ML IJ SOLN
INTRAMUSCULAR | Status: DC | PRN
  Administered 2019-05-22 (×2): 50 ug via INTRAVENOUS

## 2019-05-22 MED ORDER — ROCURONIUM BROMIDE 100 MG/10ML IV SOLN
INTRAVENOUS | Status: DC | PRN
  Administered 2019-05-22: 20 mg via INTRAVENOUS

## 2019-05-22 MED ORDER — CEFAZOLIN SODIUM-DEXTROSE 2-3 GM-%(50ML) IV SOLR
INTRAVENOUS | Status: DC | PRN
  Administered 2019-05-22: 2 g via INTRAVENOUS

## 2019-05-22 MED ORDER — PROPOFOL 10 MG/ML IV BOLUS
INTRAVENOUS | Status: DC | PRN
  Administered 2019-05-22: 16 mg via INTRAVENOUS

## 2019-05-22 SURGICAL SUPPLY — 44 items
BLADE SURG 15 STRL LF DISP TIS (BLADE) IMPLANT
BLADE SURG 15 STRL SS (BLADE)
CANISTER SUCT 3000ML PPV (MISCELLANEOUS) ×3 IMPLANT
CLEANER TIP ELECTROSURG 2X2 (MISCELLANEOUS) ×3 IMPLANT
CORD BIPOLAR FORCEPS 12FT (ELECTRODE) IMPLANT
COVER SURGICAL LIGHT HANDLE (MISCELLANEOUS) ×3 IMPLANT
COVER WAND RF STERILE (DRAPES) ×3 IMPLANT
DRAPE HALF SHEET 40X57 (DRAPES) IMPLANT
ELECT COATED BLADE 2.86 ST (ELECTRODE) ×3 IMPLANT
ELECT NDL TIP 2.8 STRL (NEEDLE) IMPLANT
ELECT NEEDLE TIP 2.8 STRL (NEEDLE) IMPLANT
ELECT REM PT RETURN 9FT ADLT (ELECTROSURGICAL) ×3
ELECTRODE REM PT RTRN 9FT ADLT (ELECTROSURGICAL) ×1 IMPLANT
EVACUATOR SILICONE 100CC (DRAIN) IMPLANT
GLOVE BIOGEL M 7.0 STRL (GLOVE) ×6 IMPLANT
GOWN STRL REUS W/ TWL LRG LVL3 (GOWN DISPOSABLE) ×2 IMPLANT
GOWN STRL REUS W/TWL LRG LVL3 (GOWN DISPOSABLE) ×6
KIT BASIN OR (CUSTOM PROCEDURE TRAY) ×3 IMPLANT
KIT TURNOVER KIT B (KITS) ×3 IMPLANT
LOCATOR NERVE 3 VOLT (DISPOSABLE) IMPLANT
NDL HYPO 25GX1X1/2 BEV (NEEDLE) IMPLANT
NEEDLE HYPO 25GX1X1/2 BEV (NEEDLE) IMPLANT
NS IRRIG 1000ML POUR BTL (IV SOLUTION) ×3 IMPLANT
PAD ARMBOARD 7.5X6 YLW CONV (MISCELLANEOUS) ×6 IMPLANT
PENCIL BUTTON HOLSTER BLD 10FT (ELECTRODE) ×3 IMPLANT
STAPLER VISISTAT 35W (STAPLE) ×2 IMPLANT
SUT CHROMIC 4 0 P 3 18 (SUTURE) ×2 IMPLANT
SUT CHROMIC 4 0 RB 1X27 (SUTURE) ×2 IMPLANT
SUT ETHILON 3 0 PS 1 (SUTURE) IMPLANT
SUT ETHILON 5 0 P 3 18 (SUTURE) ×2
SUT ETHILON 5 0 PS 2 18 (SUTURE) IMPLANT
SUT NYLON ETHILON 5-0 P-3 1X18 (SUTURE) IMPLANT
SUT PLAIN 5 0 P 3 18 (SUTURE) IMPLANT
SUT SILK 2 0 PERMA HAND 18 BK (SUTURE) IMPLANT
SUT SILK 3 0 REEL (SUTURE) IMPLANT
SUT VIC AB 3-0 PS2 18 (SUTURE)
SUT VIC AB 3-0 PS2 18XBRD (SUTURE) IMPLANT
SUT VIC AB 4-0 P-3 18X BRD (SUTURE) IMPLANT
SUT VIC AB 4-0 P-3 18XBRD (SUTURE) IMPLANT
SUT VIC AB 4-0 P3 18 (SUTURE) ×3
SUT VIC AB 5-0 RB1 27 (SUTURE) ×2 IMPLANT
TOWEL GREEN STERILE FF (TOWEL DISPOSABLE) ×3 IMPLANT
TRAY ENT MC OR (CUSTOM PROCEDURE TRAY) ×3 IMPLANT
WATER STERILE IRR 1000ML POUR (IV SOLUTION) ×3 IMPLANT

## 2019-05-22 NOTE — Anesthesia Procedure Notes (Signed)
Procedure Name: Intubation Date/Time: 05/22/2019 11:07 PM Performed by: Josephine Igo, CRNA Pre-anesthesia Checklist: Suction available, Patient being monitored, Emergency Drugs available and Patient identified Patient Re-evaluated:Patient Re-evaluated prior to induction Oxygen Delivery Method: Circle system utilized Preoxygenation: Pre-oxygenation with 100% oxygen Induction Type: IV induction and Rapid sequence Laryngoscope Size: Miller and 2 Grade View: Grade I Tube type: Oral Tube size: 7.5 mm Number of attempts: 1

## 2019-05-22 NOTE — ED Provider Notes (Signed)
[facility] EMERGENCY DEPARTMENT Provider Note   CSN: [id number] Arrival date & time: 05/22/19  1615     History   Chief Complaint Chief Complaint  Patient presents with   Fall   Facial Injury    HPI Traci Chavez is a 73 y.o. female who presents with a fall and facial laceration. No significant PMH. She states she tripped and fell in to a planter today about 3 hours ago. She sustained a large laceration to the upper lip. She reports an associated mild frontal headache ~6/10. She denies significant facial pain. No LOC, neck pain, back pain, extremity pain, chest pain, SOB, abdominal pain. She has been able to ambulate without difficulty. NPO since 1:30PM. She is UTD on tetanus. She is not on blood thinners or any antiplatelets   HPI  Past Medical History:  Diagnosis Date   Complication of anesthesia    CTS (carpal tunnel syndrome)    left   PONV (postoperative nausea and vomiting)    Trigger thumb of left hand     There are no active problems to display for this patient.   Past Surgical History:  Procedure Laterality Date   CARPAL TUNNEL RELEASE Right    CARPAL TUNNEL RELEASE Left 08/29/2017   Procedure: LEFT CARPAL TUNNEL RELEASE;  Surgeon: Daryll Brod, MD;  Location: [facility];  Service: Orthopedics;  Laterality: Left;   CLAVICLE SURGERY     fracture   EYE SURGERY Bilateral    cataracts   TRIGGER FINGER RELEASE Left 08/29/2017   Procedure: LEFT RELEASE TRIGGER FINGER/A-1 PULLEY;  Surgeon: Daryll Brod, MD;  Location: [facility];  Service: Orthopedics;  Laterality: Left;   TRIGGER FINGER RELEASE Right 01/22/2019   Procedure: RELEASE TRIGGER FINGER/A-1 PULLEY RIGHT MIDDLE FINGER;  Surgeon: Daryll Brod, MD;  Location: [facility];  Service: Orthopedics;  Laterality: Right;  FAB   TUBAL LIGATION       OB History   No obstetric history on file.      Home Medications    Prior to Admission  medications   Medication Sig Start Date End Date Taking? Authorizing Provider  cycloSPORINE (RESTASIS) 0.05 % ophthalmic emulsion 1 drop 2 (two) times daily.    [provider]  Ginkgo Biloba 40 MG TABS Take by mouth.    [provider]  Multiple Vitamin (MULTIVITAMIN WITH MINERALS) TABS tablet Take 1 tablet by mouth daily.    [provider]  Omega 3 1000 MG CAPS Take by mouth.    [provider]    Family History No family history on file.  Social History Social History   Tobacco Use   Smoking status: Never Smoker   Smokeless tobacco: Never Used  Substance Use Topics   Alcohol use: No   Drug use: No     Allergies   Claritin-d 12 hour [loratadine-pseudoephedrine er] and Vicodin [hydrocodone-acetaminophen]   Review of Systems Review of Systems  Eyes: Negative for visual disturbance.  Respiratory: Negative for shortness of breath.   Cardiovascular: Negative for chest pain.  Gastrointestinal: Negative for abdominal pain.  Skin: Positive for wound.  Neurological: Positive for headaches. Negative for dizziness and syncope.  All other systems reviewed and are negative.    Physical Exam Updated Vital Signs BP (!) 181/94    Pulse 96    Temp 98.2 F (36.8 C) (Axillary)    Resp 18    SpO2 100%   Physical Exam Vitals signs and  nursing note reviewed.  Constitutional:      General: She is not in acute distress.    Appearance: Normal appearance. She is well-developed. She is not ill-appearing.  HENT:     Head: Normocephalic.     Comments: Large, deep, complex laceration to the upper lip which extends through to the maxilla and crosses the vermillion border. Her top incisor is visible Eyes:     General: No scleral icterus.       Right eye: No discharge.        Left eye: No discharge.     Conjunctiva/sclera: Conjunctivae normal.     Pupils: Pupils are equal, round, and reactive to light.  Neck:     Musculoskeletal: Normal range of  motion.  Cardiovascular:     Rate and Rhythm: Normal rate and regular rhythm.  Pulmonary:     Effort: Pulmonary effort is normal. No respiratory distress.     Breath sounds: Normal breath sounds.  Abdominal:     General: There is no distension.  Skin:    General: Skin is warm and dry.  Neurological:     Mental Status: She is alert and oriented to person, place, and time.  Psychiatric:        Behavior: Behavior normal.        ED Treatments / Results  Labs (all labs ordered are listed, but only abnormal results are displayed) Labs Reviewed  CBC - Abnormal; Notable for the following components:      Result Value   WBC 10.9 (*)    All other components within normal limits  BASIC METABOLIC PANEL - Abnormal; Notable for the following components:   Glucose, Bld 112 (*)    All other components within normal limits  SARS CORONAVIRUS 2 (HOSPITAL ORDER, [place] [facility])    EKG None  Radiology Ct Head Wo Contrast  Result Date: 05/22/2019 CLINICAL DATA:  Head laceration after fall. No loss of consciousness. EXAM: CT HEAD WITHOUT CONTRAST CT MAXILLOFACIAL WITHOUT CONTRAST TECHNIQUE: Multidetector CT imaging of the head and maxillofacial structures were performed using the standard protocol without intravenous contrast. Multiplanar CT image reconstructions of the maxillofacial structures were also generated. COMPARISON:  None. FINDINGS: CT HEAD FINDINGS Brain: No evidence of acute infarction, hemorrhage, hydrocephalus, extra-axial collection or mass lesion/mass effect. Vascular: No hyperdense vessel or unexpected calcification. Skull: Normal. Negative for fracture or focal lesion. Other: None. CT MAXILLOFACIAL FINDINGS Osseous: There appears to be a minimally displaced fracture involving the anterior nasal protuberance of the anterior maxilla. No other osseous abnormality is noted. Orbits: Negative. No traumatic or inflammatory finding. Sinuses: Clear. Soft tissues:  Soft tissue contusion is seen overlying the left mandible with soft tissue gas in the left pre mandibular soft tissues. IMPRESSION: Normal head CT. Possible minimally displaced fracture involving the anterior nasal protuberance of the anterior maxilla. Soft tissue contusion is seen overlying the left mandible with soft tissue gas in the left premandibular soft tissues. Electronically Signed   By: Marijo Conception M.D.   On: 05/22/2019 19:49   Ct Maxillofacial Wo Contrast  Result Date: 05/22/2019 CLINICAL DATA:  Head laceration after fall. No loss of consciousness. EXAM: CT HEAD WITHOUT CONTRAST CT MAXILLOFACIAL WITHOUT CONTRAST TECHNIQUE: Multidetector CT imaging of the head and maxillofacial structures were performed using the standard protocol without intravenous contrast. Multiplanar CT image reconstructions of the maxillofacial structures were also generated. COMPARISON:  None. FINDINGS: CT HEAD FINDINGS Brain: No evidence of acute infarction, hemorrhage, hydrocephalus,  extra-axial collection or mass lesion/mass effect. Vascular: No hyperdense vessel or unexpected calcification. Skull: Normal. Negative for fracture or focal lesion. Other: None. CT MAXILLOFACIAL FINDINGS Osseous: There appears to be a minimally displaced fracture involving the anterior nasal protuberance of the anterior maxilla. No other osseous abnormality is noted. Orbits: Negative. No traumatic or inflammatory finding. Sinuses: Clear. Soft tissues: Soft tissue contusion is seen overlying the left mandible with soft tissue gas in the left pre mandibular soft tissues. IMPRESSION: Normal head CT. Possible minimally displaced fracture involving the anterior nasal protuberance of the anterior maxilla. Soft tissue contusion is seen overlying the left mandible with soft tissue gas in the left premandibular soft tissues. Electronically Signed   By: Marijo Conception M.D.   On: 05/22/2019 19:49    Procedures Procedures (including critical care  time)  Medications Ordered in ED Medications  ceFAZolin (ANCEF) IVPB 1 g/50 mL premix (0 g Intravenous Stopped 05/22/19 2003)  propofol (DIPRIVAN) 10 mg/mL bolus/IV push (has no administration in time range)  fentaNYL (SUBLIMAZE) 250 MCG/5ML injection (has no administration in time range)  midazolam (VERSED) 2 MG/2ML injection (has no administration in time range)     Initial Impression / Assessment and Plan / ED Course  I have reviewed the triage vital signs and the nursing notes.  Pertinent labs & imaging results that were available during my care of the patient were reviewed by me and considered in my medical decision making (see chart for details).  73 year old female presents with a large complex facial laceration after a mechanical fall today. She is hypertensive but otherwise vitals are normal. Labs, Ancef, CT maxface and head ordered. C-spine deferred as she has no neck pain or tenderness. Shared visit with Dr. Sedonia Small. Will discuss with ENT.  7:00 PM Discussed with Dr. [place] Cornelia - he will take pt to OR. Labs are normal. CT shows possible non-displaced maxillary fracture  9:15 PM COVID is negative.  Final Clinical Impressions(s) / ED Diagnoses   Final diagnoses:  Facial laceration, initial encounter  Fall, initial encounter  Open fracture of maxilla, unspecified laterality, initial encounter [facility])    ED Discharge Orders    None       Recardo Evangelist, PA-C 05/22/19 2204    Maudie Flakes, MD 05/22/19 2329

## 2019-05-22 NOTE — Anesthesia Preprocedure Evaluation (Signed)
Anesthesia Evaluation  Patient identified by MRN, date of birth, ID band Patient awake    Reviewed: Allergy & Precautions, H&P , NPO status , Patient's Chart, lab work & pertinent test results  History of Anesthesia Complications (+) PONV and history of anesthetic complications  Airway Mallampati: II   Neck ROM: full    Dental   Pulmonary neg pulmonary ROS,    breath sounds clear to auscultation       Cardiovascular negative cardio ROS   Rhythm:regular Rate:Normal     Neuro/Psych  Neuromuscular disease    GI/Hepatic   Endo/Other    Renal/GU      Musculoskeletal   Abdominal   Peds  Hematology   Anesthesia Other Findings   Reproductive/Obstetrics                             Anesthesia Physical Anesthesia Plan  ASA: II  Anesthesia Plan: General   Post-op Pain Management:    Induction: Intravenous  PONV Risk Score and Plan: 4 or greater and Ondansetron, Dexamethasone, Scopolamine patch - Pre-op and Treatment may vary due to age or medical condition  Airway Management Planned: Oral ETT  Additional Equipment:   Intra-op Plan:   Post-operative Plan: Extubation in OR  Informed Consent: I have reviewed the patients History and Physical, chart, labs and discussed the procedure including the risks, benefits and alternatives for the proposed anesthesia with the patient or authorized representative who has indicated his/her understanding and acceptance.       Plan Discussed with: CRNA, Anesthesiologist and Surgeon  Anesthesia Plan Comments:         Anesthesia Quick Evaluation

## 2019-05-22 NOTE — H&P (Signed)
Traci Chavez is an 73 y.o. female.   Chief Complaint: Complex facial laceration HPI: pt tripped this pm and struck face and lip  Past Medical History:  Diagnosis Date  . Complication of anesthesia   . CTS (carpal tunnel syndrome)    left  . PONV (postoperative nausea and vomiting)   . Trigger thumb of left hand     Past Surgical History:  Procedure Laterality Date  . CARPAL TUNNEL RELEASE Right   . CARPAL TUNNEL RELEASE Left 08/29/2017   Procedure: LEFT CARPAL TUNNEL RELEASE;  Surgeon: Daryll Brod, MD;  Location: [facility];  Service: Orthopedics;  Laterality: Left;  . CLAVICLE SURGERY     fracture  . EYE SURGERY Bilateral    cataracts  . TRIGGER FINGER RELEASE Left 08/29/2017   Procedure: LEFT RELEASE TRIGGER FINGER/A-1 PULLEY;  Surgeon: Daryll Brod, MD;  Location: [facility];  Service: Orthopedics;  Laterality: Left;  . TRIGGER FINGER RELEASE Right 01/22/2019   Procedure: RELEASE TRIGGER FINGER/A-1 PULLEY RIGHT MIDDLE FINGER;  Surgeon: Daryll Brod, MD;  Location: [facility];  Service: Orthopedics;  Laterality: Right;  FAB  . TUBAL LIGATION      No family history on file. Social History:  reports that she has never smoked. She has never used smokeless tobacco. She reports that she does not drink alcohol or use drugs.  Allergies:  Allergies  Allergen Reactions  . Claritin-D 12 Hour [Loratadine-Pseudoephedrine Er] Other (See Comments)    BP severely elevated  . Vicodin [Hydrocodone-Acetaminophen] Nausea Only    (Not in a hospital admission)   Results for orders placed or performed during the hospital encounter of 05/22/19 (from the past 48 hour(s))  CBC     Status: Abnormal   Collection Time: 05/22/19  6:49 PM  Result Value Ref Range   WBC 10.9 (H) 4.0 - 10.5 K/uL   RBC 4.42 3.87 - 5.11 MIL/uL   Hemoglobin 14.5 12.0 - 15.0 g/dL   HCT 42.5 36.0 - 46.0 %   MCV 96.2 80.0 - 100.0 fL   MCH 32.8 26.0 - 34.0 pg   MCHC 34.1 30.0 -  36.0 g/dL   RDW 12.4 11.5 - 15.5 %   Platelets 212 150 - 400 K/uL   nRBC 0.0 0.0 - 0.2 %    Comment: Performed at [facility], [place] [address] [place], [place] [id number]  Basic metabolic panel     Status: Abnormal   Collection Time: 05/22/19  6:49 PM  Result Value Ref Range   Sodium 137 135 - 145 mmol/L   Potassium 3.8 3.5 - 5.1 mmol/L   Chloride 105 98 - 111 mmol/L   CO2 22 22 - 32 mmol/L   Glucose, Bld 112 (H) 70 - 99 mg/dL   BUN 18 8 - 23 mg/dL   Creatinine, Ser 0.80 0.44 - 1.00 mg/dL   Calcium 9.3 8.9 - 10.3 mg/dL   GFR calc non Af Amer >60 >60 mL/min   GFR calc Af Amer >60 >60 mL/min   Anion gap 10 5 - 15    Comment: Performed at [facility], [place] [address], [place], [place] [address]  SARS Coronavirus 2 [facility] order, Performed in [facility] hospital lab) Nasopharyngeal Nasopharyngeal Swab     Status: None   Collection Time: 05/22/19  8:03 PM   Specimen: Nasopharyngeal Swab  Result Value Ref Range   SARS Coronavirus 2 NEGATIVE NEGATIVE    Comment: (NOTE) If  result is NEGATIVE SARS-CoV-2 target nucleic acids are NOT DETECTED. The SARS-CoV-2 RNA is generally detectable in upper and lower  respiratory specimens during the acute phase of infection. The lowest  concentration of SARS-CoV-2 viral copies this assay can detect is 250  copies / mL. A negative result does not preclude SARS-CoV-2 infection  and should not be used as the sole basis for treatment or other  patient management decisions.  A negative result may occur with  improper specimen collection / handling, submission of specimen other  than nasopharyngeal swab, presence of viral mutation(s) within the  areas targeted by this assay, and inadequate number of viral copies  (<250 copies / mL). A negative result must be combined with clinical  observations, patient history, and epidemiological information. If result is POSITIVE SARS-CoV-2 target nucleic acids are DETECTED. The SARS-CoV-2 RNA is  generally detectable in upper and lower  respiratory specimens dur ing the acute phase of infection.  Positive  results are indicative of active infection with SARS-CoV-2.  Clinical  correlation with patient history and other diagnostic information is  necessary to determine patient infection status.  Positive results do  not rule out bacterial infection or co-infection with other viruses. If result is PRESUMPTIVE POSTIVE SARS-CoV-2 nucleic acids MAY BE PRESENT.   A presumptive positive result was obtained on the submitted specimen  and confirmed on repeat testing.  While 2019 novel coronavirus  (SARS-CoV-2) nucleic acids may be present in the submitted sample  additional confirmatory testing may be necessary for epidemiological  and / or clinical management purposes  to differentiate between  SARS-CoV-2 and other Sarbecovirus currently known to infect humans.  If clinically indicated additional testing with an alternate test  methodology [phone]) is advised. The SARS-CoV-2 RNA is generally  detectable in upper and lower respiratory sp ecimens during the acute  phase of infection. The expected result is Negative. Fact Sheet for Patients:  StrictlyIdeas.no Fact Sheet for Healthcare Providers: BankingDealers.co.za This test is not yet approved or cleared by the [place] FDA and has been authorized for detection and/or diagnosis of SARS-CoV-2 by FDA under an Emergency Use Authorization (EUA).  This EUA will remain in effect (meaning this test can be used) for the duration of the COVID-19 declaration under Section 564(b)(1) of the Act, 21 U.S.C. section 360bbb-3(b)(1), unless the authorization is terminated or revoked sooner. Performed at [facility], [place] [address] [place], [place] [address]    Ct Head Wo Contrast  Result Date: 05/22/2019 CLINICAL DATA:  Head laceration after fall. No loss of consciousness. EXAM: CT HEAD  WITHOUT CONTRAST CT MAXILLOFACIAL WITHOUT CONTRAST TECHNIQUE: Multidetector CT imaging of the head and maxillofacial structures were performed using the standard protocol without intravenous contrast. Multiplanar CT image reconstructions of the maxillofacial structures were also generated. COMPARISON:  None. FINDINGS: CT HEAD FINDINGS Brain: No evidence of acute infarction, hemorrhage, hydrocephalus, extra-axial collection or mass lesion/mass effect. Vascular: No hyperdense vessel or unexpected calcification. Skull: Normal. Negative for fracture or focal lesion. Other: None. CT MAXILLOFACIAL FINDINGS Osseous: There appears to be a minimally displaced fracture involving the anterior nasal protuberance of the anterior maxilla. No other osseous abnormality is noted. Orbits: Negative. No traumatic or inflammatory finding. Sinuses: Clear. Soft tissues: Soft tissue contusion is seen overlying the left mandible with soft tissue gas in the left pre mandibular soft tissues. IMPRESSION: Normal head CT. Possible minimally displaced fracture involving the anterior nasal protuberance of the anterior maxilla. Soft tissue contusion is seen overlying the  left mandible with soft tissue gas in the left premandibular soft tissues. Electronically Signed   By: Marijo Conception M.D.   On: 05/22/2019 19:49   Ct Maxillofacial Wo Contrast  Result Date: 05/22/2019 CLINICAL DATA:  Head laceration after fall. No loss of consciousness. EXAM: CT HEAD WITHOUT CONTRAST CT MAXILLOFACIAL WITHOUT CONTRAST TECHNIQUE: Multidetector CT imaging of the head and maxillofacial structures were performed using the standard protocol without intravenous contrast. Multiplanar CT image reconstructions of the maxillofacial structures were also generated. COMPARISON:  None. FINDINGS: CT HEAD FINDINGS Brain: No evidence of acute infarction, hemorrhage, hydrocephalus, extra-axial collection or mass lesion/mass effect. Vascular: No hyperdense vessel or unexpected  calcification. Skull: Normal. Negative for fracture or focal lesion. Other: None. CT MAXILLOFACIAL FINDINGS Osseous: There appears to be a minimally displaced fracture involving the anterior nasal protuberance of the anterior maxilla. No other osseous abnormality is noted. Orbits: Negative. No traumatic or inflammatory finding. Sinuses: Clear. Soft tissues: Soft tissue contusion is seen overlying the left mandible with soft tissue gas in the left pre mandibular soft tissues. IMPRESSION: Normal head CT. Possible minimally displaced fracture involving the anterior nasal protuberance of the anterior maxilla. Soft tissue contusion is seen overlying the left mandible with soft tissue gas in the left premandibular soft tissues. Electronically Signed   By: Marijo Conception M.D.   On: 05/22/2019 19:49    Review of Systems  Constitutional: Negative.   HENT: Negative.     Blood pressure (!) 145/71, pulse 74, temperature 98.2 F (36.8 C), temperature source Axillary, resp. rate 18, SpO2 99 %. Physical Exam  Constitutional: She appears well-developed and well-nourished.  HENT:  Complex facial laceration  Neck: Normal range of motion. Neck supple.  Cardiovascular: Normal rate.  Respiratory: Effort normal.     Assessment/Plan Adm for reconstruction of Complex facial laceration.  Jerrell Belfast, MD 05/22/2019, 10:53 PM

## 2019-05-22 NOTE — ED Triage Notes (Signed)
Pt reports she tripped and fell today, significant laceration noted to upper lip extending into her nasal septum. Bleeding controlled with gauze, denies LOC, denies taking blood thinners. No other injuries from the fall.

## 2019-05-23 DIAGNOSIS — S0121XA Laceration without foreign body of nose, initial encounter: Secondary | ICD-10-CM | POA: Diagnosis not present

## 2019-05-23 DIAGNOSIS — Z20828 Contact with and (suspected) exposure to other viral communicable diseases: Secondary | ICD-10-CM | POA: Diagnosis not present

## 2019-05-23 DIAGNOSIS — Z888 Allergy status to other drugs, medicaments and biological substances status: Secondary | ICD-10-CM | POA: Diagnosis not present

## 2019-05-23 DIAGNOSIS — S01511A Laceration without foreign body of lip, initial encounter: Secondary | ICD-10-CM | POA: Diagnosis not present

## 2019-05-23 DIAGNOSIS — Z885 Allergy status to narcotic agent status: Secondary | ICD-10-CM | POA: Diagnosis not present

## 2019-05-23 DIAGNOSIS — S02401B Maxillary fracture, unspecified, initial encounter for open fracture: Secondary | ICD-10-CM | POA: Diagnosis not present

## 2019-05-23 MED ORDER — FENTANYL CITRATE (PF) 100 MCG/2ML IJ SOLN
INTRAMUSCULAR | Status: AC
  Filled 2019-05-23: qty 2

## 2019-05-23 MED ORDER — CEPHALEXIN 500 MG PO CAPS: 500.0000 mg | ORAL_CAPSULE | Freq: Three times a day (TID) | ORAL | 0 refills | Status: AC

## 2019-05-23 MED ORDER — OXYCODONE HCL 5 MG PO TABS
ORAL_TABLET | ORAL | Status: AC
  Filled 2019-05-23: qty 1

## 2019-05-23 MED ORDER — SUGAMMADEX SODIUM 200 MG/2ML IV SOLN
INTRAVENOUS | Status: DC | PRN
  Administered 2019-05-23: 120 mg via INTRAVENOUS

## 2019-05-23 NOTE — Discharge Instructions (Signed)
Laceration Care, Adult A laceration is a cut that may go through all layers of the skin. The cut may also go into the tissue that is right under the skin. Some cuts heal on their own. Others need to be closed with stitches (sutures), staples, skin adhesive strips, or skin glue. Taking care of your injury lowers your risk of infection, helps your injury to heal better, and may prevent scarring. Supplies needed:  Soap.  Water.  Hand sanitizer.  Bandage (dressing).  Antibiotic ointment.  Clean towel. How to take care of your cut Wash your hands with soap and water before touching your wound or changing your bandage. If soap and water are not available, use hand sanitizer. If your doctor used stitches or staples:  Keep the wound clean and dry.  If you were given a bandage, change it at least once a day as told by your doctor. You should also change it if it gets wet or dirty.  Keep the wound completely dry for the first 24 hours, or as told by your doctor. After that, you may take a shower or a bath. Do not get the wound soaked in water until after the stitches or staples have been removed.  Clean the wound once a day, or as told by your doctor: ? Wash the wound with soap and water. ? Rinse the wound with water to remove all soap. ? Pat the wound dry with a clean towel. Do not rub the wound.  After you clean the wound, put a thin layer of antibiotic ointment on it as told by your doctor. This ointment: ? Helps to prevent infection. ? Keeps the bandage from sticking to the wound.  Have your stitches or staples removed as told by your doctor. If your doctor used skin adhesive strips:  Keep the wound clean and dry.  If you were given a bandage, you should change it at least once a day as told by your doctor. You should also change it if it gets wet or dirty.  Do not get the skin adhesive strips wet. You can take a shower or a bath, but keep the wound dry.  If the wound gets wet,  pat it dry with a clean towel. Do not rub the wound.  Skin adhesive strips fall off on their own. You can trim the strips as the wound heals. Do not remove any strips that are still stuck to the wound. They will fall off after a while. If your doctor used skin glue:  Try to keep your wound dry, but you may briefly wet it in the shower or bath. Do not soak the wound in water, such as by swimming.  After you take a shower or a bath, gently pat the wound dry with a clean towel. Do not rub the wound.  Do not do any activities that will make you really sweaty until the skin glue has fallen off on its own.  Do not apply liquid, cream, or ointment medicine to your wound while the skin glue is still on.  If you were given a bandage, you should change it at least once a day or as told by your doctor. You should also change it if it gets dirty or wet.  If a bandage is placed over the wound, do not let the tape touch the skin glue.  Do not pick at the glue. The skin glue usually stays on for 5-10 days. Then, it falls off the skin. General  instructions   Take over-the-counter and prescription medicines only as told by your doctor.  If you were given antibiotic medicine or ointment, take or apply it as told by your doctor. Do not stop using it even if your condition improves.  Do not scratch or pick at the wound.  Check your wound every day for signs of infection. Watch for: ? Redness, swelling, or pain. ? Fluid, blood, or pus.  Raise (elevate) the injured area above the level of your heart while you are sitting or lying down.  If directed, put ice on the affected area: ? Put ice in a plastic bag. ? Place a towel between your skin and the bag. ? Leave the ice on for 20 minutes, 2-3 times a day.  Prevent scarring by covering your wound with sunscreen of at least 30 SPF whenever you are outside after your wound has healed.  Keep all follow-up visits as told by your doctor. This is  important. Get help if:  You got a tetanus shot and you have any of these problems at the injection site: ? Swelling. ? Very bad pain. ? Redness. ? Bleeding.  You have a fever.  A wound that was closed breaks open.  You notice a bad smell coming from your wound or your bandage.  You notice something coming out of the wound, such as wood or glass.  Medicine does not relieve your pain.  You have more redness, swelling, or pain at the site of your wound.  You have fluid, blood, or pus coming from your wound.  You notice a change in the color of your skin near your wound.  You need to change the bandage often because fluid, blood, or pus is coming from the wound.  You start to have a new rash.  You start to have numbness around the wound. Get help right away if:  You have very bad swelling around the wound.  Your pain suddenly gets worse and is very bad.  You notice painful lumps near the wound or anywhere on your body.  You have a red streak going away from your wound.  The wound is on your hand or foot, and: ? You cannot move a finger or toe. ? Your fingers or toes look pale or bluish. Summary  A laceration is a cut that may go through all layers of the skin. The cut may also go into the tissue right under the skin.  Some cuts heal on their own. Others need to be closed with stitches, staples, skin adhesive strips, or skin glue.  Follow your doctor's instructions for caring for your cut. Proper care of a cut lowers the risk of infection, helps the cut heal better, and prevents scarring. This information is not intended to replace advice given to you by your health care provider. Make sure you discuss any questions you have with your health care provider. Document Released: 01/30/2008 Document Revised: 10/11/2017 Document Reviewed: 09/02/2017 Elsevier Patient Education  2020 [place].   Jaw Fracture Eating Plan A break (fracture) of the jaw bone often needs  surgery for treatment. After surgery, you will need to eat foods that can be blended so that they can be sipped from a straw or given through a syringe. Work with a diet and nutrition specialist (dietitian) to create an eating plan that helps you get the nutrients you need in order to heal and stay healthy. What are tips for following this plan? General guidelines  All foods  in this plan must be blended. Avoid nuts, seeds, skins, peels, bones, or any foods that cannot be blended to the right consistency.  Ask your health care provider about taking a liquid multivitamin to make sure that you get all the vitamins and minerals you need. Cooking   Before blending, remove any skins, seeds, or peels from food.  Cook meats and vegetables until tender.  Cut foods into small pieces and mix with a small amount of liquid in a food processor or blender. Continue to add liquid until the food becomes thin enough to sip through a straw.  Add liquids such as juice, milk, cream, broth, gravy, or vegetable juice to help add flavor to foods.  Heat foods after they have been blended, not before. This reduces the amount of foam created from blending.  If you need to increase calories in food: ? Add protein powder or powdered milk to foods. ? Cook with fats, such as margarine (without trans fat), sour cream, cream cheese, cream, or nut butters. ? Prepare foods with sweeteners, such as honey, ice cream, blackstrap molasses, or sugar. Meal planning  Eat at least three meals and three snacks daily. It is important to make sure that you get enough calories and protein to prevent weight loss and help your body heal, especially after surgery.  Eat a variety of foods from each food group every day, including fruits and vegetables, protein, whole grains, dairy, and healthy fats.  If your teeth and mouth are sensitive to extreme temperatures, heat or cool your foods to lukewarm temperatures. What foods are  recommended? The items listed may not be a complete list. Talk with your dietitian about what dietary choices are best for you. Grains Hot cereals, such as oatmeal, grits, ground wheat cereals, and polenta. Rice and pasta. Couscous. Vegetables All cooked or canned vegetables, without seeds and skins. Vegetable juices. Cooked potatoes, without skins. Fruits Any cooked or canned fruits, without seeds and skins. Fresh, peeled soft fruits, such as bananas and peaches, that can be blended until smooth. All fruit juices, without seeds and skins. Meat and other protein foods Soft-boiled eggs, scrambled eggs, powdered eggs, pasteurized egg mixtures, and custard. Ground meats, such as hamburger, [place], sausage, and meatloaf. Tender, well-cooked meat, poultry, and fish, prepared without bones or skin. Soft soy foods, such as tofu. Smooth nut butters. Liquid egg substitutes. Dairy Milk. Cheese. Yogurt. Cottage cheese. Pudding. Beverages Coffee (regular or decaffeinated), tea, and mineral water. Liquid supplements that have protein and calories. Fats and oils Any oils. Melted margarine or butter. Ghee. Sour cream. Cream cheese. Avocado. Seasoning and other foods All seasonings and condiments that blend well. Ground spices. Finely ground seeds and nuts. Mustard or any smooth condiment. Summary  Foods in this plan need to be prepared so that they can be sipped from a straw or given through a syringe. Try to have at least three meals and three snacks daily.  Avoid nuts, seeds, skins, peels, bones, or any foods that cannot be blended to the right consistency. Make sure you eat a variety of foods from each food group every day.  Include a liquid multivitamin in your plan as told by your health care provider or dietitian. This information is not intended to replace advice given to you by your health care provider. Make sure you discuss any questions you have with your health care provider. Document  Released: 01/31/2010 Document Revised: 12/05/2018 Document Reviewed: 11/20/2016 Elsevier Patient Education  2020 [employer].  Mandibular Fracture  A mandibular fracture is a break in the jawbone. What are the causes? The most common cause of this injury is a direct blow (trauma) to the jaw. This can happen from:  A car crash.  Physical violence.  A fall from a high place. What are the signs or symptoms? Symptoms of this injury include:  Pain.  Swelling.  Difficulty closing the mouth.  Feeling that your teeth are not aligned properly when you close your mouth (malocclusion).  Difficulty speaking.  Difficulty swallowing. How is this diagnosed? This injury may be diagnosed with a medical history and physical exam. Your health care provider may order imaging tests, such as an X-ray or CT scan. How is this treated? This injury may be treated with:  Rest. For mild fractures, resting the jaw may be the only treatment needed.  Surgery to put the jaw back in the right position. During surgery, wires are usually placed around the teeth to hold the jaw in place while it heals.  Taking medicine for pain.  Applying ice to the jaw. This can help with pain and swelling.  Eating a soft or liquid diet. Follow these instructions at home:  If directed, apply ice to the injured area: ? Put ice in a plastic bag. ? Place a towel between your skin and the bag. ? Leave the ice on for 20 minutes, 2-3 times per day.  Take over-the-counter and prescription medicines only as told by your health care provider.  Eat a well-balanced, high-protein soft or liquid diet as told by your health care provider.  Sleep on your back to avoid putting pressure on your jaw.  Avoid exercising to the point that you become short of breath. Contact a health care provider if:  You have a severe headache.  Parts of your face feel numb.  You have severe jaw pain that is not relieved with  medicine.  You have uncontrollable nausea or anxiety.  Your swelling or redness gets worse. Get help right away if:  You have a fever.  You have difficulty breathing.  You feel like your airway is tightening.  You cannot swallow your saliva.  You make a high-pitched whistling sound when you breathe (wheezing). This information is not intended to replace advice given to you by your health care provider. Make sure you discuss any questions you have with your health care provider. Document Released: 08/13/2005 Document Revised: 09/15/2015 Document Reviewed: 05/08/2015 Elsevier Patient Education  [place] Instructions  Activity: Get plenty of rest for the remainder of the day. A responsible individual must stay with you for 24 hours following the procedure.  For the next 24 hours, DO NOT: -Drive a car -Paediatric nurse -Drink alcoholic beverages -Take any medication unless instructed by your physician -Make any legal decisions or sign important papers.  Meals: Start with liquid foods such as gelatin or soup. Progress to regular foods as tolerated. Avoid greasy, spicy, heavy foods. If nausea and/or vomiting occur, drink only clear liquids until the nausea and/or vomiting subsides. Call your physician if vomiting continues.  Special Instructions/Symptoms: Your throat may feel dry or sore from the anesthesia or the breathing tube placed in your throat during surgery. If this causes discomfort, gargle with warm salt water. The discomfort should disappear within 24 hours.  If you had a scopolamine patch placed behind your ear for the management of post- operative nausea and/or vomiting:  1. The medication in the patch  is effective for 72 hours, after which it should be removed.  Wrap patch in a tissue and discard in the trash. Wash hands thoroughly with soap and water. 2. You may remove the patch earlier than 72 hours if you experience  unpleasant side effects which may include dry mouth, dizziness or visual disturbances. 3. Avoid touching the patch. Wash your hands with soap and water after contact with the patch.

## 2019-05-23 NOTE — Op Note (Signed)
Operative Note: RECONSTRUCTION COMPLEX FACIAL LACERATION  Patient: Traci Chavez  Medical record number: [id number]  Date:05/23/2019  Pre-operative Indications: 7 cm complex Facial Laceration involving the upper lip     3 cm intranasal laceration  Postoperative Indications: Same  Surgical Procedure: Debridement and Repair of Complex Facial Lacerations  Anesthesia: GET  Surgeon: Delsa Bern, M.D.  Assist: None  Complications: None  EBL: 50 cc   Brief History: The patient is a 73 y.o. female with a history of complex facial laceration.  She fell at a relatives home striking her face with significant bleeding.  She was brought to [facility] for evaluation in the emergency department.  CT scan showed nondisplaced maxillary crest fracture, no other facial fractures.  The patient had extensive facial lacerations with a through and through laceration of the upper lip extending from the right nasal ala to the vermilion border.  This involves the skin, underlying orbicularis muscle and intraoral mucosa.  The patient also had a 3 cm left lateral intranasal laceration. Given the patient's history and findings I recommended debridement and complex reconstruction of facial lacerations under general anesthesia, risks and benefits were discussed in detail with the patient and their family. They understand and agree with our plan for surgery which is scheduled at [facility] on an emergency basis.  Surgical Procedure: The patient is brought to the operating room on 05/23/2019 and placed in supine position on the operating table. General endotracheal anesthesia was established without difficulty. When the patient was adequately anesthetized, surgical timeout was performed and correct identification of the patient and the surgical procedure. The patient was positioned and prepped and draped in sterile fashion.  The patient's wound was carefully examined and cleaned of foreign  material using half-strength hydroperoxide and Betadine surgical prep.  Multiple complex lacerations were then closed in a layered fashion.  Hemostasis was maintained with Bovie electrocautery at several points within the complex laceration.  Closure was begun on the intraoral side with interrupted 4-0 chromic suture to reapproximate the intraoral laceration.  Interrupted 4-0 and 5-0 Vicryl sutures then used to reapproximate the orbicularis muscle layer and to reapproximate the immediate subcutaneous layer.  Final skin edge closure with interrupted 5-0 Ethilon suture on the lip skin and interrupted 4-0 chromic suture on the anterior mucosal surface across the vermilion border.  The patient had a moderate amount of bleeding from the left nasal passageway and examination after suction showed a 3 cm curvilinear laceration at the lateral nasal margin adjacent to the anterior aspect of the inferior turbinate and lateral nasal wall.  This was cauterized with electrocautery and then closed in multiple interrupted 4-0 chromic sutures.  An orogastric tube was passed and stomach contents were aspirated. Patient was awakened from anesthetic and transferred from the operating room to the recovery room in stable condition. There were no complications and blood loss was minimal.   Delsa Bern, M.D. [facility] 05/23/2019

## 2019-05-23 NOTE — Transfer of Care (Signed)
Immediate Anesthesia Transfer of Care Note  Patient: Traci Chavez  Procedure(s) Performed: FACIAL LACERATION REPAIR (N/A )  Patient Location: PACU  Anesthesia Type:MAC and General  Level of Consciousness: sedated  Airway & Oxygen Therapy: Patient Spontanous Breathing and Patient connected to nasal cannula oxygen  Post-op Assessment: Report given to RN and Post -op Vital signs reviewed and stable  Post vital signs: Reviewed and stable  Last Vitals:  Vitals Value Taken Time  BP 141/123 05/23/19 0015  Temp    Pulse 96 05/23/19 0017  Resp 16 05/23/19 0017  SpO2 99 % 05/23/19 0017  Vitals shown include unvalidated device data.  Last Pain:  Vitals:   05/22/19 1829  TempSrc:   PainSc: 6          Complications: No apparent anesthesia complications

## 2019-05-24 NOTE — Anesthesia Postprocedure Evaluation (Signed)
Anesthesia Post Note  Patient: [name]  Procedure(s) Performed: FACIAL LACERATION REPAIR (N/A )     Patient location during evaluation: PACU Anesthesia Type: General Level of consciousness: awake and alert Pain management: pain level controlled Vital Signs Assessment: post-procedure vital signs reviewed and stable Respiratory status: spontaneous breathing, nonlabored ventilation, respiratory function stable and patient connected to nasal cannula oxygen Cardiovascular status: blood pressure returned to baseline and stable Postop Assessment: no apparent nausea or vomiting Anesthetic complications: no    Last Vitals:  Vitals:   05/23/19 0052 05/23/19 0055  BP:  (!) 108/51  Pulse: 78 74  Resp: 17 18  Temp: 36.9 C 36.9 C  SpO2: 94% 96%    Last Pain:  Vitals:   05/23/19 0055  TempSrc:   PainSc: 0-No pain                 Cory Kitt S

## 2019-05-25 ENCOUNTER — Encounter (HOSPITAL_COMMUNITY): Payer: Self-pay | Admitting: Otolaryngology

## 2019-07-13 DIAGNOSIS — S0181XA Laceration without foreign body of other part of head, initial encounter: Secondary | ICD-10-CM | POA: Diagnosis not present

## 2019-09-21 DIAGNOSIS — S0181XA Laceration without foreign body of other part of head, initial encounter: Secondary | ICD-10-CM | POA: Diagnosis not present

## 2019-09-21 DIAGNOSIS — H9193 Unspecified hearing loss, bilateral: Secondary | ICD-10-CM | POA: Diagnosis not present

## 2019-10-01 DIAGNOSIS — Z1331 Encounter for screening for depression: Secondary | ICD-10-CM | POA: Diagnosis not present

## 2019-10-01 DIAGNOSIS — E785 Hyperlipidemia, unspecified: Secondary | ICD-10-CM | POA: Diagnosis not present

## 2019-10-01 DIAGNOSIS — Z6826 Body mass index (BMI) 26.0-26.9, adult: Secondary | ICD-10-CM | POA: Diagnosis not present

## 2019-10-01 DIAGNOSIS — H04123 Dry eye syndrome of bilateral lacrimal glands: Secondary | ICD-10-CM | POA: Diagnosis not present

## 2019-10-01 DIAGNOSIS — M2550 Pain in unspecified joint: Secondary | ICD-10-CM | POA: Diagnosis not present

## 2019-11-19 DIAGNOSIS — Z85828 Personal history of other malignant neoplasm of skin: Secondary | ICD-10-CM | POA: Diagnosis not present

## 2019-11-19 DIAGNOSIS — L570 Actinic keratosis: Secondary | ICD-10-CM | POA: Diagnosis not present

## 2019-12-04 DIAGNOSIS — N9089 Other specified noninflammatory disorders of vulva and perineum: Secondary | ICD-10-CM | POA: Diagnosis not present

## 2019-12-07 DIAGNOSIS — H903 Sensorineural hearing loss, bilateral: Secondary | ICD-10-CM | POA: Diagnosis not present

## 2019-12-07 DIAGNOSIS — H9313 Tinnitus, bilateral: Secondary | ICD-10-CM | POA: Diagnosis not present

## 2019-12-07 DIAGNOSIS — H9193 Unspecified hearing loss, bilateral: Secondary | ICD-10-CM | POA: Diagnosis not present

## 2020-03-10 DIAGNOSIS — H26491 Other secondary cataract, right eye: Secondary | ICD-10-CM | POA: Diagnosis not present

## 2020-03-10 DIAGNOSIS — H52203 Unspecified astigmatism, bilateral: Secondary | ICD-10-CM | POA: Diagnosis not present

## 2020-03-10 DIAGNOSIS — Z961 Presence of intraocular lens: Secondary | ICD-10-CM | POA: Diagnosis not present

## 2020-03-10 DIAGNOSIS — H524 Presbyopia: Secondary | ICD-10-CM | POA: Diagnosis not present

## 2020-04-13 DIAGNOSIS — H26491 Other secondary cataract, right eye: Secondary | ICD-10-CM | POA: Diagnosis not present

## 2020-04-14 ENCOUNTER — Other Ambulatory Visit: Payer: Self-pay | Admitting: Internal Medicine

## 2020-04-14 DIAGNOSIS — Z1231 Encounter for screening mammogram for malignant neoplasm of breast: Secondary | ICD-10-CM

## 2020-05-05 ENCOUNTER — Other Ambulatory Visit: Payer: Self-pay

## 2020-05-05 ENCOUNTER — Ambulatory Visit
Admission: RE | Admit: 2020-05-05 | Discharge: 2020-05-05 | Disposition: A | Discharge status: Home / Self Care | Payer: Medicare Other | Source: Ambulatory Visit | Attending: Internal Medicine | Admitting: Internal Medicine

## 2020-05-05 DIAGNOSIS — Z1231 Encounter for screening mammogram for malignant neoplasm of breast: Secondary | ICD-10-CM

## 2020-05-09 ENCOUNTER — Other Ambulatory Visit: Payer: Self-pay | Admitting: Internal Medicine

## 2020-05-09 DIAGNOSIS — R928 Other abnormal and inconclusive findings on diagnostic imaging of breast: Secondary | ICD-10-CM

## 2020-05-20 ENCOUNTER — Other Ambulatory Visit: Payer: Self-pay

## 2020-05-20 ENCOUNTER — Ambulatory Visit
Admission: RE | Admit: 2020-05-20 | Discharge: 2020-05-20 | Disposition: A | Discharge status: Home / Self Care | Payer: Medicare Other | Source: Ambulatory Visit | Attending: Internal Medicine | Admitting: Internal Medicine

## 2020-05-20 DIAGNOSIS — R928 Other abnormal and inconclusive findings on diagnostic imaging of breast: Secondary | ICD-10-CM | POA: Diagnosis not present

## 2020-05-20 DIAGNOSIS — N6489 Other specified disorders of breast: Secondary | ICD-10-CM | POA: Diagnosis not present

## 2020-06-01 DIAGNOSIS — D2261 Melanocytic nevi of right upper limb, including shoulder: Secondary | ICD-10-CM | POA: Diagnosis not present

## 2020-06-01 DIAGNOSIS — L820 Inflamed seborrheic keratosis: Secondary | ICD-10-CM | POA: Diagnosis not present

## 2020-06-01 DIAGNOSIS — L918 Other hypertrophic disorders of the skin: Secondary | ICD-10-CM | POA: Diagnosis not present

## 2020-06-01 DIAGNOSIS — L821 Other seborrheic keratosis: Secondary | ICD-10-CM | POA: Diagnosis not present

## 2020-06-01 DIAGNOSIS — L814 Other melanin hyperpigmentation: Secondary | ICD-10-CM | POA: Diagnosis not present

## 2020-06-01 DIAGNOSIS — D1801 Hemangioma of skin and subcutaneous tissue: Secondary | ICD-10-CM | POA: Diagnosis not present

## 2020-06-01 DIAGNOSIS — Z85828 Personal history of other malignant neoplasm of skin: Secondary | ICD-10-CM | POA: Diagnosis not present

## 2020-07-05 DIAGNOSIS — Z20828 Contact with and (suspected) exposure to other viral communicable diseases: Secondary | ICD-10-CM | POA: Diagnosis not present

## 2020-07-05 DIAGNOSIS — J3489 Other specified disorders of nose and nasal sinuses: Secondary | ICD-10-CM | POA: Diagnosis not present

## 2020-07-30 DIAGNOSIS — Z23 Encounter for immunization: Secondary | ICD-10-CM | POA: Diagnosis not present

## 2020-09-27 DIAGNOSIS — E785 Hyperlipidemia, unspecified: Secondary | ICD-10-CM | POA: Diagnosis not present

## 2020-09-27 DIAGNOSIS — H04123 Dry eye syndrome of bilateral lacrimal glands: Secondary | ICD-10-CM | POA: Diagnosis not present

## 2020-11-07 DIAGNOSIS — Z6827 Body mass index (BMI) 27.0-27.9, adult: Secondary | ICD-10-CM | POA: Diagnosis not present

## 2020-11-07 DIAGNOSIS — Z1331 Encounter for screening for depression: Secondary | ICD-10-CM | POA: Diagnosis not present

## 2020-11-07 DIAGNOSIS — Z0000 Encounter for general adult medical examination without abnormal findings: Secondary | ICD-10-CM | POA: Diagnosis not present

## 2020-11-07 DIAGNOSIS — Z1339 Encounter for screening examination for other mental health and behavioral disorders: Secondary | ICD-10-CM | POA: Diagnosis not present

## 2021-02-21 DIAGNOSIS — U071 COVID-19: Secondary | ICD-10-CM | POA: Diagnosis not present

## 2021-04-17 DIAGNOSIS — H524 Presbyopia: Secondary | ICD-10-CM | POA: Diagnosis not present

## 2021-04-17 DIAGNOSIS — H52203 Unspecified astigmatism, bilateral: Secondary | ICD-10-CM | POA: Diagnosis not present

## 2021-04-17 DIAGNOSIS — H5213 Myopia, bilateral: Secondary | ICD-10-CM | POA: Diagnosis not present

## 2021-04-17 DIAGNOSIS — Z961 Presence of intraocular lens: Secondary | ICD-10-CM | POA: Diagnosis not present

## 2021-06-23 ENCOUNTER — Other Ambulatory Visit: Payer: Self-pay | Admitting: Internal Medicine

## 2021-06-23 DIAGNOSIS — Z1231 Encounter for screening mammogram for malignant neoplasm of breast: Secondary | ICD-10-CM

## 2021-07-28 ENCOUNTER — Ambulatory Visit
Admission: RE | Admit: 2021-07-28 | Discharge: 2021-07-28 | Disposition: A | Discharge status: Home / Self Care | Payer: Medicare Other | Source: Ambulatory Visit | Attending: Internal Medicine | Admitting: Internal Medicine

## 2021-07-28 ENCOUNTER — Other Ambulatory Visit: Payer: Self-pay

## 2021-07-28 DIAGNOSIS — Z1231 Encounter for screening mammogram for malignant neoplasm of breast: Secondary | ICD-10-CM

## 2021-10-31 DIAGNOSIS — E785 Hyperlipidemia, unspecified: Secondary | ICD-10-CM | POA: Diagnosis not present

## 2021-10-31 DIAGNOSIS — H04123 Dry eye syndrome of bilateral lacrimal glands: Secondary | ICD-10-CM | POA: Diagnosis not present

## 2021-11-07 DIAGNOSIS — Z1331 Encounter for screening for depression: Secondary | ICD-10-CM | POA: Diagnosis not present

## 2021-11-07 DIAGNOSIS — Z0000 Encounter for general adult medical examination without abnormal findings: Secondary | ICD-10-CM | POA: Diagnosis not present

## 2021-11-07 DIAGNOSIS — Z6826 Body mass index (BMI) 26.0-26.9, adult: Secondary | ICD-10-CM | POA: Diagnosis not present

## 2022-03-19 DIAGNOSIS — Z85828 Personal history of other malignant neoplasm of skin: Secondary | ICD-10-CM | POA: Diagnosis not present

## 2022-03-19 DIAGNOSIS — L298 Other pruritus: Secondary | ICD-10-CM | POA: Diagnosis not present

## 2022-03-19 DIAGNOSIS — L814 Other melanin hyperpigmentation: Secondary | ICD-10-CM | POA: Diagnosis not present

## 2022-03-19 DIAGNOSIS — L821 Other seborrheic keratosis: Secondary | ICD-10-CM | POA: Diagnosis not present

## 2022-03-19 DIAGNOSIS — D485 Neoplasm of uncertain behavior of skin: Secondary | ICD-10-CM | POA: Diagnosis not present

## 2022-03-19 DIAGNOSIS — L570 Actinic keratosis: Secondary | ICD-10-CM | POA: Diagnosis not present

## 2022-03-19 DIAGNOSIS — L820 Inflamed seborrheic keratosis: Secondary | ICD-10-CM | POA: Diagnosis not present

## 2022-04-23 DIAGNOSIS — H04123 Dry eye syndrome of bilateral lacrimal glands: Secondary | ICD-10-CM | POA: Diagnosis not present

## 2022-04-23 DIAGNOSIS — Z961 Presence of intraocular lens: Secondary | ICD-10-CM | POA: Diagnosis not present

## 2022-08-31 ENCOUNTER — Other Ambulatory Visit: Payer: Self-pay | Admitting: Internal Medicine

## 2022-08-31 DIAGNOSIS — Z1231 Encounter for screening mammogram for malignant neoplasm of breast: Secondary | ICD-10-CM

## 2022-09-06 ENCOUNTER — Ambulatory Visit
Admission: RE | Admit: 2022-09-06 | Discharge: 2022-09-06 | Disposition: A | Discharge status: Home / Self Care | Payer: Medicare Other | Source: Ambulatory Visit | Attending: Internal Medicine | Admitting: Internal Medicine

## 2022-09-06 DIAGNOSIS — Z1231 Encounter for screening mammogram for malignant neoplasm of breast: Secondary | ICD-10-CM | POA: Diagnosis not present

## 2022-10-15 DIAGNOSIS — Z0000 Encounter for general adult medical examination without abnormal findings: Secondary | ICD-10-CM | POA: Diagnosis not present

## 2022-10-15 DIAGNOSIS — Z1339 Encounter for screening examination for other mental health and behavioral disorders: Secondary | ICD-10-CM | POA: Diagnosis not present

## 2022-10-15 DIAGNOSIS — Z1331 Encounter for screening for depression: Secondary | ICD-10-CM | POA: Diagnosis not present

## 2022-10-15 DIAGNOSIS — Z85828 Personal history of other malignant neoplasm of skin: Secondary | ICD-10-CM | POA: Diagnosis not present

## 2022-10-15 DIAGNOSIS — H04123 Dry eye syndrome of bilateral lacrimal glands: Secondary | ICD-10-CM | POA: Diagnosis not present

## 2022-10-15 DIAGNOSIS — Z13220 Encounter for screening for lipoid disorders: Secondary | ICD-10-CM | POA: Diagnosis not present

## 2023-03-20 DIAGNOSIS — D225 Melanocytic nevi of trunk: Secondary | ICD-10-CM | POA: Diagnosis not present

## 2023-03-20 DIAGNOSIS — L820 Inflamed seborrheic keratosis: Secondary | ICD-10-CM | POA: Diagnosis not present

## 2023-03-20 DIAGNOSIS — D2262 Melanocytic nevi of left upper limb, including shoulder: Secondary | ICD-10-CM | POA: Diagnosis not present

## 2023-03-20 DIAGNOSIS — L821 Other seborrheic keratosis: Secondary | ICD-10-CM | POA: Diagnosis not present

## 2023-03-20 DIAGNOSIS — L814 Other melanin hyperpigmentation: Secondary | ICD-10-CM | POA: Diagnosis not present

## 2023-03-20 DIAGNOSIS — C44319 Basal cell carcinoma of skin of other parts of face: Secondary | ICD-10-CM | POA: Diagnosis not present

## 2023-03-20 DIAGNOSIS — D2371 Other benign neoplasm of skin of right lower limb, including hip: Secondary | ICD-10-CM | POA: Diagnosis not present

## 2023-03-20 DIAGNOSIS — Z85828 Personal history of other malignant neoplasm of skin: Secondary | ICD-10-CM | POA: Diagnosis not present

## 2023-04-22 DIAGNOSIS — C44319 Basal cell carcinoma of skin of other parts of face: Secondary | ICD-10-CM | POA: Diagnosis not present

## 2023-04-22 DIAGNOSIS — Z85828 Personal history of other malignant neoplasm of skin: Secondary | ICD-10-CM | POA: Diagnosis not present

## 2023-05-16 DIAGNOSIS — H524 Presbyopia: Secondary | ICD-10-CM | POA: Diagnosis not present

## 2023-05-16 DIAGNOSIS — Z961 Presence of intraocular lens: Secondary | ICD-10-CM | POA: Diagnosis not present

## 2023-08-14 ENCOUNTER — Other Ambulatory Visit: Payer: Self-pay | Admitting: Internal Medicine

## 2023-08-14 DIAGNOSIS — Z1231 Encounter for screening mammogram for malignant neoplasm of breast: Secondary | ICD-10-CM

## 2023-09-11 ENCOUNTER — Ambulatory Visit
Admission: RE | Admit: 2023-09-11 | Discharge: 2023-09-11 | Disposition: A | Discharge status: Home / Self Care | Payer: Medicare Other | Source: Ambulatory Visit | Attending: Internal Medicine | Admitting: Internal Medicine

## 2023-09-11 DIAGNOSIS — Z1231 Encounter for screening mammogram for malignant neoplasm of breast: Secondary | ICD-10-CM

## 2023-10-02 IMAGING — MG MM DIGITAL SCREENING BILAT W/ TOMO AND CAD
8 series · 9 of 24 positions shown · non-contrast
Comparison: Previous exam(s).

CLINICAL DATA: Screening.

EXAM:
DIGITAL SCREENING BILATERAL MAMMOGRAM WITH TOMOSYNTHESIS AND CAD
TECHNIQUE: Bilateral screening digital craniocaudal and mediolateral oblique
mammograms were obtained. Bilateral screening digital breast
tomosynthesis was performed. The images were evaluated with
computer-aided detection.

[L CC synth-2D]
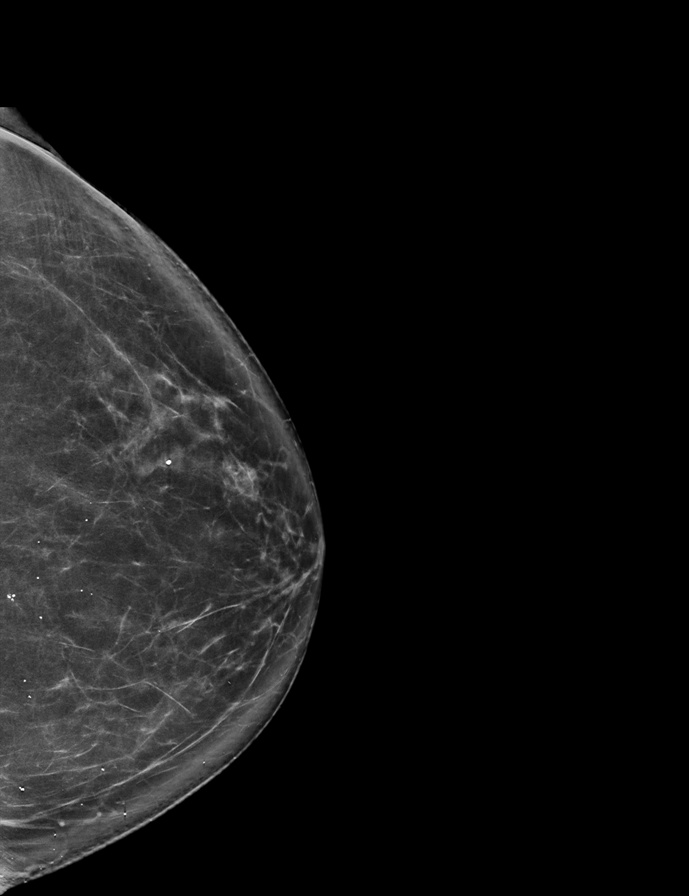

[R MLO synth-2D]
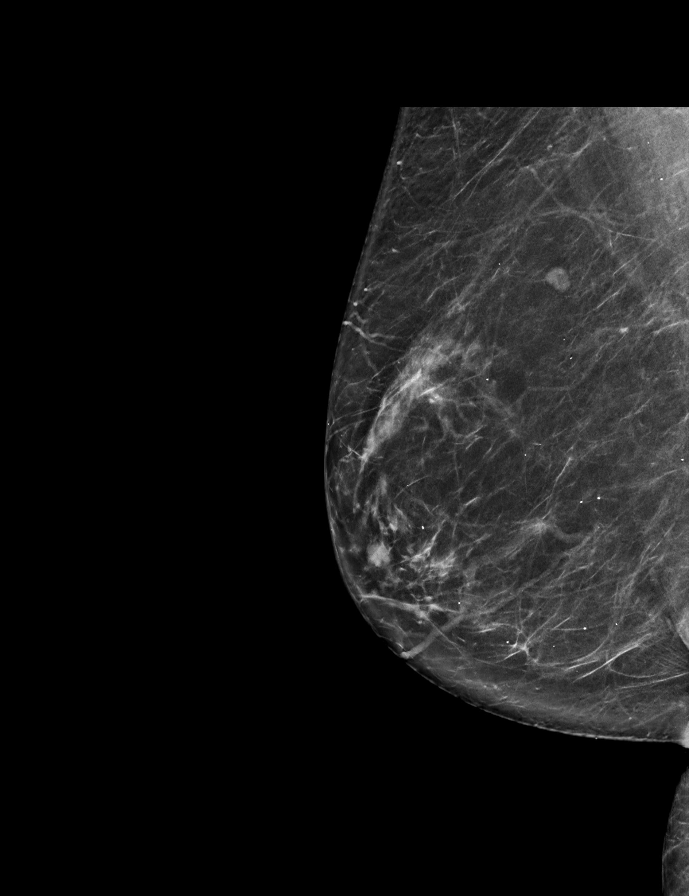

[R CC synth-2D]
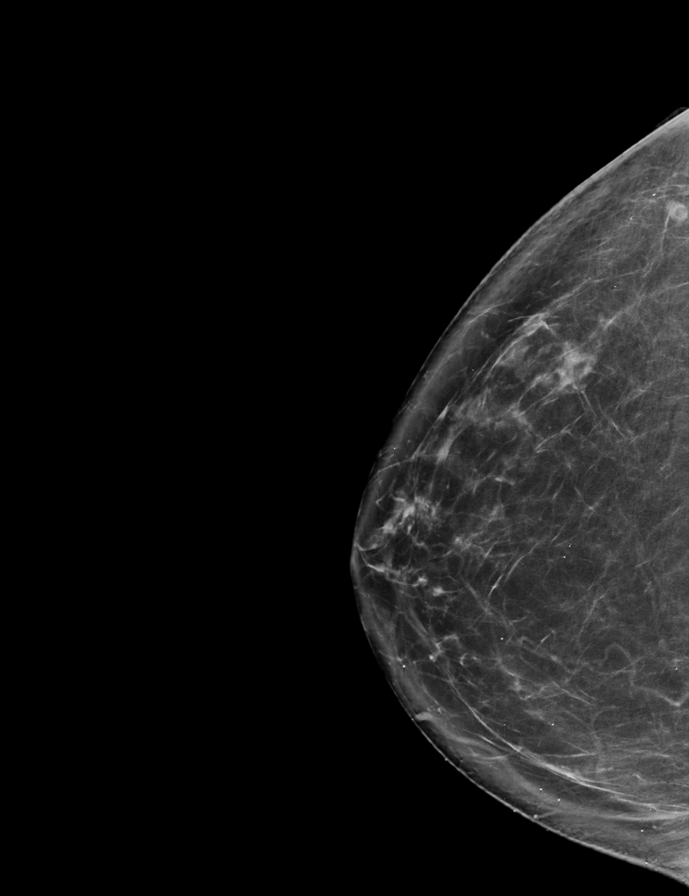

[L MLO synth-2D]
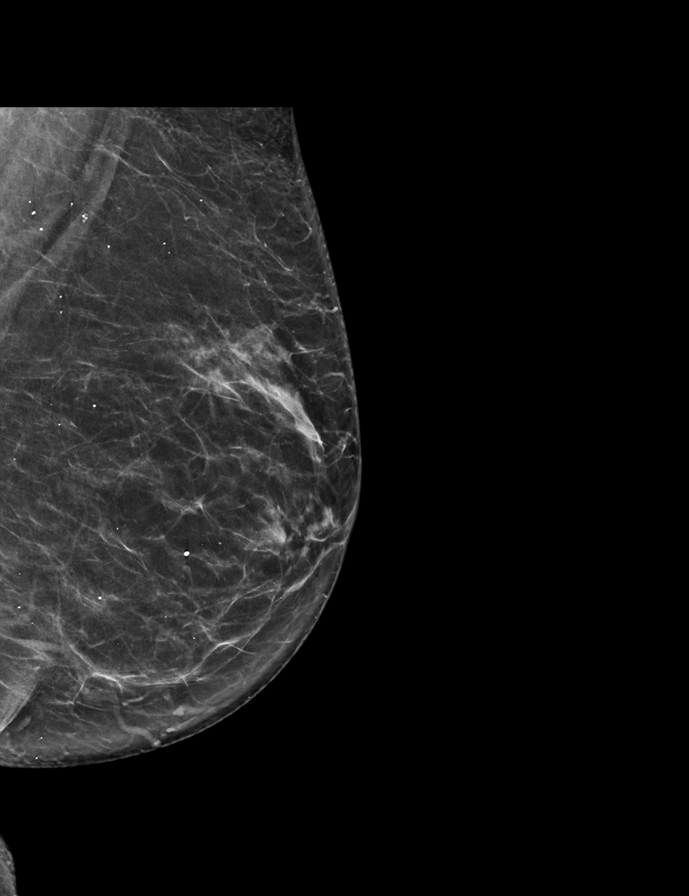

[R CC tomo · 2 of 77 frames shown]
[frame 25/77]
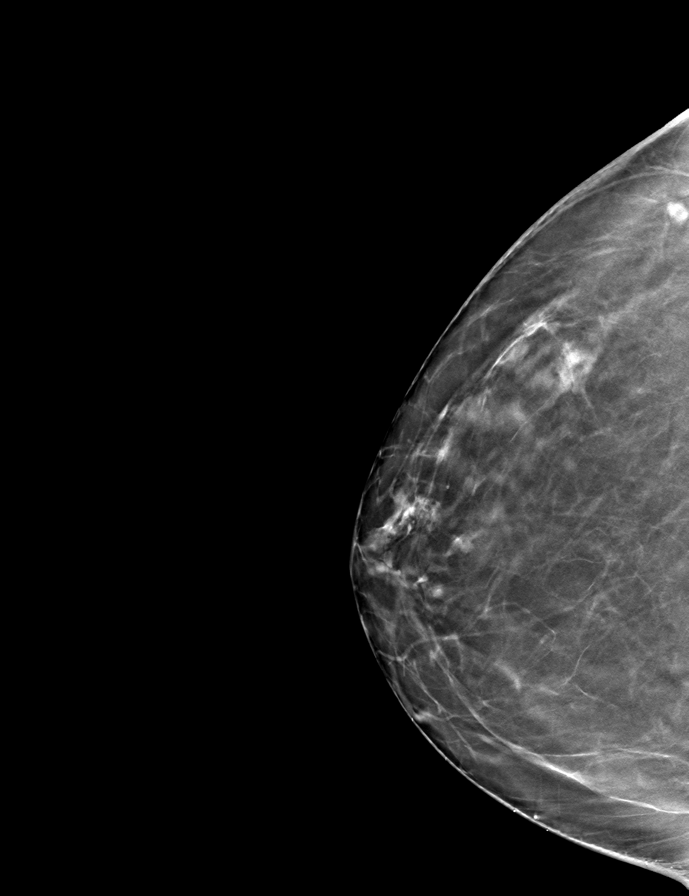
[frame 39/77]
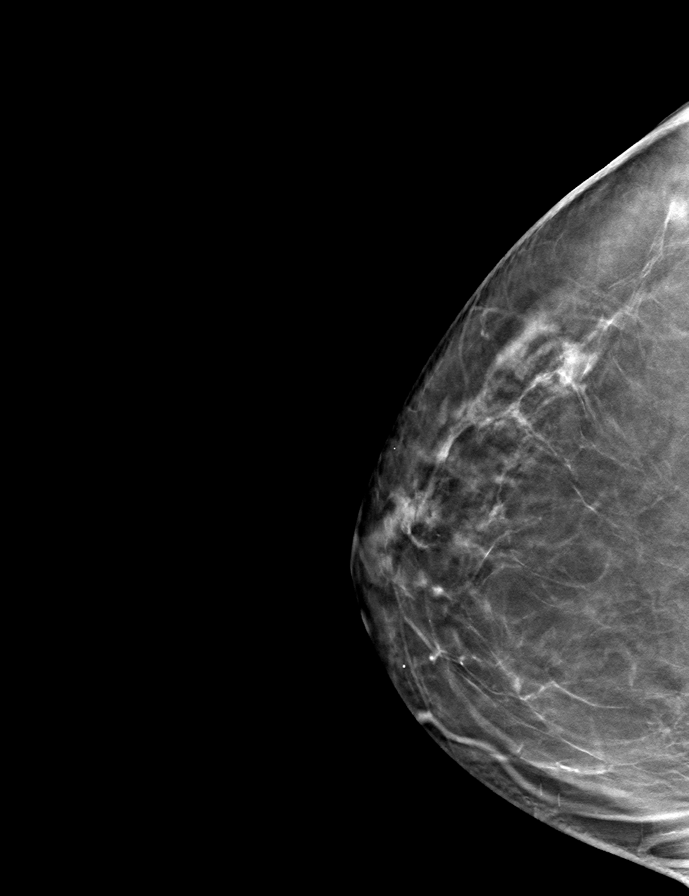

[R MLO tomo · tomo slice 37/74.0]
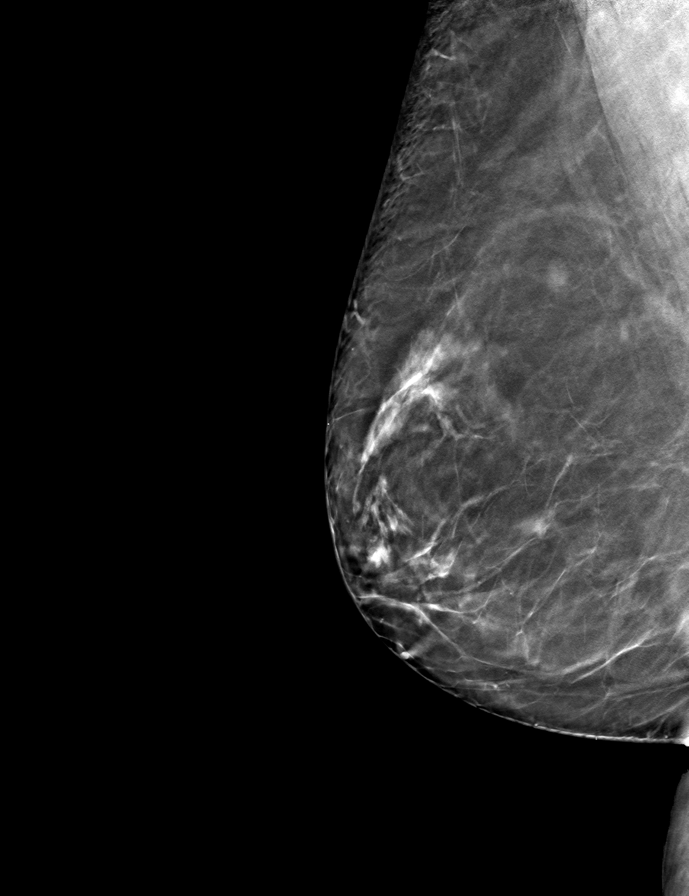

[L CC tomo · tomo slice 39/76.0]
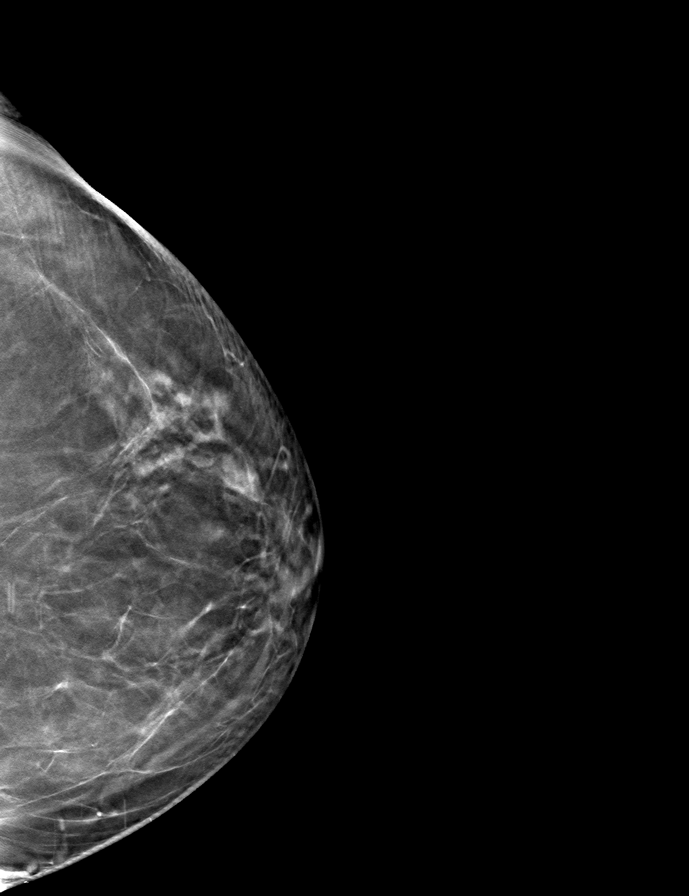

[L MLO tomo · tomo slice 37/72.0]
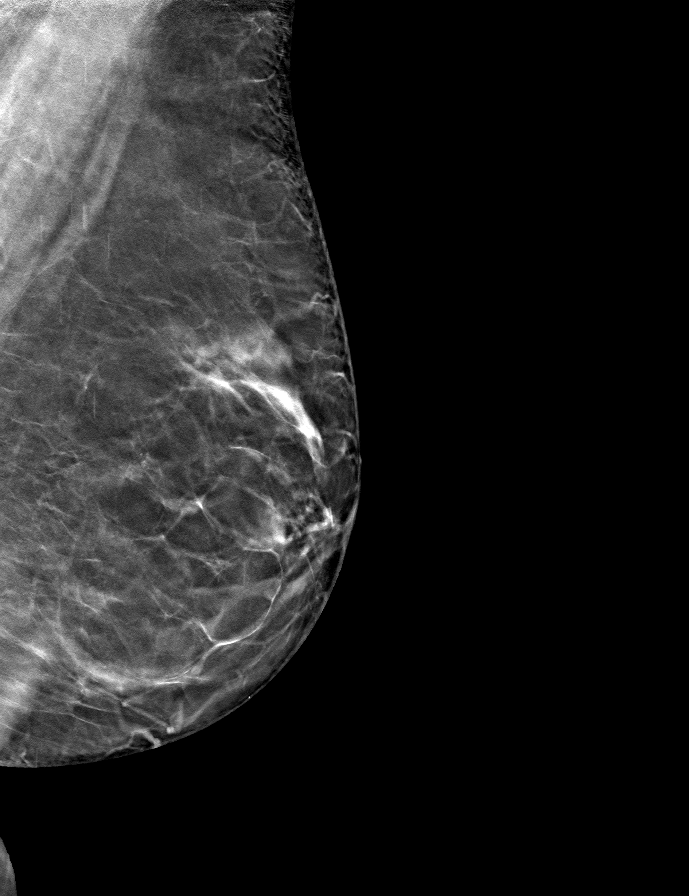

[9 of 24 positions shown; findings below may reference images not displayed]

ACR Breast Density Category b: There are scattered areas of
fibroglandular density.
FINDINGS: There are no findings suspicious for malignancy.
IMPRESSION: No mammographic evidence of malignancy. A result letter of this
screening mammogram will be mailed directly to the patient.

RECOMMENDATION:
Screening mammogram in one year. (Code:51-O-LD2)

BI-RADS CATEGORY  1: Negative.

## 2023-10-14 DIAGNOSIS — R7989 Other specified abnormal findings of blood chemistry: Secondary | ICD-10-CM | POA: Diagnosis not present

## 2023-10-14 DIAGNOSIS — Z0000 Encounter for general adult medical examination without abnormal findings: Secondary | ICD-10-CM | POA: Diagnosis not present

## 2023-10-21 DIAGNOSIS — Z85828 Personal history of other malignant neoplasm of skin: Secondary | ICD-10-CM | POA: Diagnosis not present

## 2023-10-21 DIAGNOSIS — Z13220 Encounter for screening for lipoid disorders: Secondary | ICD-10-CM | POA: Diagnosis not present

## 2023-10-21 DIAGNOSIS — Z1331 Encounter for screening for depression: Secondary | ICD-10-CM | POA: Diagnosis not present

## 2023-10-21 DIAGNOSIS — Z0000 Encounter for general adult medical examination without abnormal findings: Secondary | ICD-10-CM | POA: Diagnosis not present

## 2023-10-21 DIAGNOSIS — H04123 Dry eye syndrome of bilateral lacrimal glands: Secondary | ICD-10-CM | POA: Diagnosis not present

## 2023-10-21 DIAGNOSIS — Z1339 Encounter for screening examination for other mental health and behavioral disorders: Secondary | ICD-10-CM | POA: Diagnosis not present

## 2024-03-23 DIAGNOSIS — D225 Melanocytic nevi of trunk: Secondary | ICD-10-CM | POA: Diagnosis not present

## 2024-03-23 DIAGNOSIS — L821 Other seborrheic keratosis: Secondary | ICD-10-CM | POA: Diagnosis not present

## 2024-03-23 DIAGNOSIS — L570 Actinic keratosis: Secondary | ICD-10-CM | POA: Diagnosis not present

## 2024-03-23 DIAGNOSIS — C44219 Basal cell carcinoma of skin of left ear and external auricular canal: Secondary | ICD-10-CM | POA: Diagnosis not present

## 2024-03-23 DIAGNOSIS — Z85828 Personal history of other malignant neoplasm of skin: Secondary | ICD-10-CM | POA: Diagnosis not present

## 2024-03-23 DIAGNOSIS — L814 Other melanin hyperpigmentation: Secondary | ICD-10-CM | POA: Diagnosis not present

## 2024-04-28 DIAGNOSIS — C44219 Basal cell carcinoma of skin of left ear and external auricular canal: Secondary | ICD-10-CM | POA: Diagnosis not present

## 2024-04-28 DIAGNOSIS — Z85828 Personal history of other malignant neoplasm of skin: Secondary | ICD-10-CM | POA: Diagnosis not present

## 2024-05-22 DIAGNOSIS — H04123 Dry eye syndrome of bilateral lacrimal glands: Secondary | ICD-10-CM | POA: Diagnosis not present

## 2024-05-22 DIAGNOSIS — Z961 Presence of intraocular lens: Secondary | ICD-10-CM | POA: Diagnosis not present

## 2024-05-22 DIAGNOSIS — H524 Presbyopia: Secondary | ICD-10-CM | POA: Diagnosis not present
